# Patient Record
Sex: Male | Born: 1965 | Race: White | Hispanic: No | Marital: Married | Smoking: Current every day smoker
Health system: Southern US, Community
[De-identification: ages and names within clinical notes are randomized; demographics above are authoritative.]

## PROBLEM LIST (undated history)

## (undated) DIAGNOSIS — Z7901 Long term (current) use of anticoagulants: Secondary | ICD-10-CM

## (undated) DIAGNOSIS — Z86718 Personal history of other venous thrombosis and embolism: Secondary | ICD-10-CM

## (undated) DIAGNOSIS — D689 Coagulation defect, unspecified: Secondary | ICD-10-CM

## (undated) DIAGNOSIS — F17200 Nicotine dependence, unspecified, uncomplicated: Secondary | ICD-10-CM

## (undated) DIAGNOSIS — F1190 Opioid use, unspecified, uncomplicated: Secondary | ICD-10-CM

## (undated) DIAGNOSIS — G8929 Other chronic pain: Secondary | ICD-10-CM

## (undated) HISTORY — PX: BACK SURGERY: SHX140

## (undated) HISTORY — PX: SPLENECTOMY: SUR1306

---

## 2004-09-12 ENCOUNTER — Ambulatory Visit: Payer: Self-pay | Admitting: Pain Medicine

## 2004-09-13 ENCOUNTER — Ambulatory Visit: Payer: Self-pay | Admitting: Pain Medicine

## 2004-09-27 ENCOUNTER — Ambulatory Visit: Payer: Self-pay | Admitting: Pain Medicine

## 2004-10-10 ENCOUNTER — Ambulatory Visit: Payer: Self-pay | Admitting: Pain Medicine

## 2004-11-02 ENCOUNTER — Ambulatory Visit: Payer: Self-pay | Admitting: Pain Medicine

## 2004-11-22 ENCOUNTER — Ambulatory Visit: Payer: Self-pay | Admitting: Pain Medicine

## 2004-12-07 ENCOUNTER — Ambulatory Visit: Payer: Self-pay | Admitting: Pain Medicine

## 2005-01-08 ENCOUNTER — Inpatient Hospital Stay: Payer: Self-pay | Admitting: Unknown Physician Specialty

## 2005-01-11 ENCOUNTER — Other Ambulatory Visit: Payer: Self-pay

## 2005-07-09 ENCOUNTER — Ambulatory Visit: Payer: Self-pay | Admitting: Unknown Physician Specialty

## 2006-08-14 ENCOUNTER — Emergency Department: Payer: Self-pay | Admitting: Emergency Medicine

## 2010-08-16 ENCOUNTER — Ambulatory Visit: Payer: Self-pay | Admitting: Unknown Physician Specialty

## 2010-12-28 ENCOUNTER — Ambulatory Visit: Payer: Medicare Other | Admitting: Unknown Physician Specialty

## 2011-02-06 ENCOUNTER — Encounter: Payer: Self-pay | Admitting: Unknown Physician Specialty

## 2012-02-12 ENCOUNTER — Emergency Department: Payer: Self-pay | Admitting: Emergency Medicine

## 2012-06-29 ENCOUNTER — Emergency Department: Payer: Self-pay | Admitting: *Deleted

## 2012-06-29 LAB — COMPREHENSIVE METABOLIC PANEL
Albumin: 3.8 g/dL (ref 3.4–5.0)
Alkaline Phosphatase: 91 U/L (ref 50–136)
BUN: 9 mg/dL (ref 7–18)
Bilirubin,Total: 0.6 mg/dL (ref 0.2–1.0)
Co2: 30 mmol/L (ref 21–32)
Creatinine: 0.95 mg/dL (ref 0.60–1.30)
EGFR (Non-African Amer.): >60
Glucose: 97 mg/dL (ref 65–99)
SGOT(AST): 36 U/L (ref 15–37)
SGPT (ALT): 30 U/L
Sodium: 140 mmol/L (ref 136–145)

## 2012-08-07 ENCOUNTER — Ambulatory Visit: Payer: Self-pay | Admitting: Family Medicine

## 2013-08-12 ENCOUNTER — Ambulatory Visit: Payer: Self-pay | Admitting: Oncology

## 2013-08-25 ENCOUNTER — Emergency Department: Payer: Self-pay | Admitting: Emergency Medicine

## 2013-09-02 ENCOUNTER — Ambulatory Visit: Payer: Self-pay | Admitting: Oncology

## 2014-10-21 ENCOUNTER — Ambulatory Visit: Payer: Self-pay | Admitting: Internal Medicine

## 2015-05-25 ENCOUNTER — Emergency Department
Admission: EM | Admit: 2015-05-25 | Discharge: 2015-05-25 | Discharge status: Against Medical Advice | Payer: Medicare Other | Attending: Emergency Medicine | Admitting: Emergency Medicine

## 2015-05-25 ENCOUNTER — Encounter: Payer: Self-pay | Admitting: Emergency Medicine

## 2015-05-25 DIAGNOSIS — Y9389 Activity, other specified: Secondary | ICD-10-CM | POA: Insufficient documentation

## 2015-05-25 DIAGNOSIS — Y998 Other external cause status: Secondary | ICD-10-CM | POA: Insufficient documentation

## 2015-05-25 DIAGNOSIS — S61411A Laceration without foreign body of right hand, initial encounter: Secondary | ICD-10-CM | POA: Diagnosis present

## 2015-05-25 DIAGNOSIS — S61401A Unspecified open wound of right hand, initial encounter: Secondary | ICD-10-CM | POA: Diagnosis not present

## 2015-05-25 DIAGNOSIS — Z720 Tobacco use: Secondary | ICD-10-CM | POA: Insufficient documentation

## 2015-05-25 DIAGNOSIS — X58XXXA Exposure to other specified factors, initial encounter: Secondary | ICD-10-CM | POA: Insufficient documentation

## 2015-05-25 DIAGNOSIS — W25XXXA Contact with sharp glass, initial encounter: Secondary | ICD-10-CM | POA: Diagnosis not present

## 2015-05-25 DIAGNOSIS — Y9289 Other specified places as the place of occurrence of the external cause: Secondary | ICD-10-CM | POA: Insufficient documentation

## 2015-05-25 DIAGNOSIS — Z23 Encounter for immunization: Secondary | ICD-10-CM | POA: Insufficient documentation
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
## 2015-05-25 NOTE — ED Notes (Signed)
Pt called in lobby 3 times; no answer.

## 2015-05-26 ENCOUNTER — Emergency Department
Admission: EM | Admit: 2015-05-26 | Discharge: 2015-05-26 | Disposition: A | Discharge status: Home / Self Care | Payer: Medicare Other | Attending: Emergency Medicine | Admitting: Emergency Medicine

## 2015-05-26 ENCOUNTER — Encounter: Payer: Self-pay | Admitting: Urgent Care

## 2015-05-26 ENCOUNTER — Emergency Department: Payer: Medicare Other

## 2015-05-26 DIAGNOSIS — S61411A Laceration without foreign body of right hand, initial encounter: Secondary | ICD-10-CM

## 2015-05-26 HISTORY — DX: Coagulation defect, unspecified: D68.9

## 2015-05-26 MED ORDER — CEPHALEXIN 500 MG PO CAPS: 500.0000 mg | ORAL_CAPSULE | Freq: Two times a day (BID) | ORAL | Status: AC

## 2015-05-26 MED ORDER — MORPHINE SULFATE 2 MG/ML IJ SOLN
2.0000 mg | Freq: Once | INTRAMUSCULAR | Status: AC
  Administered 2015-05-26: 2 mg via INTRAMUSCULAR

## 2015-05-26 MED ORDER — CEPHALEXIN 500 MG PO CAPS
ORAL_CAPSULE | ORAL | Status: AC
  Administered 2015-05-26: 500 mg via ORAL
  Filled 2015-05-26: qty 1

## 2015-05-26 MED ORDER — LIDOCAINE HCL (PF) 1 % IJ SOLN
INTRAMUSCULAR | Status: AC
  Filled 2015-05-26: qty 15

## 2015-05-26 MED ORDER — OXYCODONE-ACETAMINOPHEN 5-325 MG PO TABS
ORAL_TABLET | ORAL | Status: AC
  Administered 2015-05-26: 1 via ORAL
  Filled 2015-05-26: qty 1

## 2015-05-26 MED ORDER — TETANUS-DIPHTH-ACELL PERTUSSIS 5-2.5-18.5 LF-MCG/0.5 IM SUSP
0.5000 mL | Freq: Once | INTRAMUSCULAR | Status: AC
  Administered 2015-05-26: 0.5 mL via INTRAMUSCULAR

## 2015-05-26 MED ORDER — CEPHALEXIN 500 MG PO CAPS
500.0000 mg | ORAL_CAPSULE | Freq: Once | ORAL | Status: AC
  Administered 2015-05-26: 500 mg via ORAL

## 2015-05-26 MED ORDER — TETANUS-DIPHTH-ACELL PERTUSSIS 5-2.5-18.5 LF-MCG/0.5 IM SUSP
INTRAMUSCULAR | Status: AC
  Administered 2015-05-26: 0.5 mL via INTRAMUSCULAR
  Filled 2015-05-26: qty 0.5

## 2015-05-26 MED ORDER — OXYCODONE-ACETAMINOPHEN 5-325 MG PO TABS: 1.0000 | ORAL_TABLET | ORAL | Status: AC | PRN

## 2015-05-26 MED ORDER — MORPHINE SULFATE 2 MG/ML IJ SOLN
INTRAMUSCULAR | Status: AC
  Administered 2015-05-26: 2 mg via INTRAMUSCULAR
  Filled 2015-05-26: qty 1

## 2015-05-26 MED ORDER — OXYCODONE-ACETAMINOPHEN 5-325 MG PO TABS
1.0000 | ORAL_TABLET | Freq: Once | ORAL | Status: AC
  Administered 2015-05-26: 1 via ORAL

## 2015-05-26 NOTE — ED Provider Notes (Signed)
[facility] Emergency Department Provider Note  ____________________________________________  Time seen: 3:15 AM  I have reviewed the triage vital signs and the nursing notes.   HISTORY  Chief Complaint Extremity Laceration      HPI Jonathan Rich is a 49 y.o. male resents with multiple lacerations to the dorsal aspect of the right hand patient apparently put his right hand through a glass window approximately 9 hours before the time of this note. Patient came to the emergency department earlier in the evening but apparently left without being seen and has not returned. Patient is currently taking Xarelto     Past Medical History  Diagnosis Date  . Blood clotting disorder     There are no active problems to display for this patient.   Past Surgical History  Procedure Laterality Date  . Splenectomy    . Splenectomy      No current outpatient prescriptions on file.  Allergies Review of patient's allergies indicates no known allergies.  No family history on file.  Social History History  Substance Use Topics  . Smoking status: Current Every Day Smoker  . Smokeless tobacco: Not on file  . Alcohol Use: Yes    Review of Systems  Constitutional: Negative for fever. Eyes: Negative for visual changes. ENT: Negative for sore throat. Cardiovascular: Negative for chest pain. Respiratory: Negative for shortness of breath. Gastrointestinal: Negative for abdominal pain, vomiting and diarrhea. Genitourinary: Negative for dysuria. Musculoskeletal: Negative for back pain. Skin: positive for multiple lacerations Neurological: Negative for headaches, focal weakness or numbness.   10-point ROS otherwise negative.  ____________________________________________   PHYSICAL EXAM:  VITAL SIGNS: ED Triage Vitals  Enc Vitals Group     BP 05/26/15 0032 113/67 mmHg     Pulse Rate 05/26/15 0032 107     Resp 05/26/15 0032 18     Temp 05/26/15  0032 98.1 F (36.7 C)     Temp Source 05/26/15 0032 Oral     SpO2 05/26/15 0032 95 %     Weight 05/26/15 0032 240 lb (108.863 kg)     Height 05/26/15 0032  (1.778 m)     Head Cir --      Peak Flow --      Pain Score 05/26/15 0033 9     Pain Loc --      Pain Edu? --      Excl. in GC? --      Constitutional: Alert and oriented. Well appearing and in no distress. Eyes: Conjunctivae are normal. PERRL. Normal extraocular movements. ENT   Head: Normocephalic and atraumatic.   Nose: No congestion/rhinnorhea.   Mouth/Throat: Mucous membranes are moist.   Neck: No stridor. Hematological/Lymphatic/Immunilogical: No cervical lymphadenopathy. Cardiovascular: Normal rate, regular rhythm. Normal and symmetric distal pulses are present in all extremities. No murmurs, rubs, or gallops. Respiratory: Normal respiratory effort without tachypnea nor retractions. Breath sounds are clear and equal bilaterally. No wheezes/rales/rhonchi. Gastrointestinal: Soft and nontender. No distention. There is no CVA tenderness. Genitourinary: deferred Musculoskeletal: Nontender with normal range of motion in all extremities. No joint effusions.  No lower extremity tenderness nor edema. Neurologic:  Normal speech and language. No gross focal neurologic deficits are appreciated. Speech is normal.  Skin:  Multiple lacerations noted to the dorsal aspect of the right hand 3, 4, 3 cm respectively. In addition to multiple areas of skin avulsions primarily over the PIP joints Psychiatric: Mood and affect are normal. Speech and behavior are normal. Patient exhibits appropriate  insight and judgment.  ____________________________________________    Radiology:  IMPRESSION: No evidence of fracture or dislocation. No radiopaque foreign bodies seen.     PROCEDURES  Procedure(s) performed: LACERATION REPAIR Performed by: Darci Current Authorized by: Darci Current Consent: Verbal consent  obtained. Risks and benefits: risks, benefits and alternatives were discussed Consent given by: patient Patient identity confirmed: provided demographic data Prepped and Draped in normal sterile fashion Wound explored  Laceration Location: dorsal aspect right hand Laceration Length: 3cm, 4 cm 3cm  No Foreign Bodies seen or palpated  Anesthesia: local infiltration  Local anesthetic: lidocaine 1%  Anesthetic total: 58ml  Irrigation method: syringe Amount of cleaning: standard  Skin closure: 5 0 nylon  Number of sutures: 19  Technique:simple interrupted  Patient tolerance: Patient tolerated the procedure well with no immediate complications.    ____________________________________________   INITIAL IMPRESSION / ASSESSMENT AND PLAN / ED COURSE  Pertinent labs & imaging results that were available during my care of the patient were reviewed by me and considered in my medical decision making (see chart for details).  Keflex prescribed   ____________________________________________   FINAL CLINICAL IMPRESSION(S) / ED DIAGNOSES  Final diagnoses:  Hand laceration, right, initial encounter      Darci Current, MD 05/27/15 0004

## 2015-05-26 NOTE — Discharge Instructions (Signed)
Laceration Care, Adult °A laceration is a cut or lesion that goes through all layers of the skin and into the tissue just beneath the skin. °TREATMENT  °Some lacerations may not require closure. Some lacerations may not be able to be closed due to an increased risk of infection. It is important to see your caregiver as soon as possible after an injury to minimize the risk of infection and maximize the opportunity for successful closure. °If closure is appropriate, pain medicines may be given, if needed. The wound will be cleaned to help prevent infection. Your caregiver will use stitches (sutures), staples, wound glue (adhesive), or skin adhesive strips to repair the laceration. These tools bring the skin edges together to allow for faster healing and a better cosmetic outcome. However, all wounds will heal with a scar. Once the wound has healed, scarring can be minimized by covering the wound with sunscreen during the day for 1 full year. °HOME CARE INSTRUCTIONS  °For sutures or staples: °· Keep the wound clean and dry. °· If you were given a bandage (dressing), you should change it at least once a day. Also, change the dressing if it becomes wet or dirty, or as directed by your caregiver. °· Wash the wound with soap and water 2 times a day. Rinse the wound off with water to remove all soap. Pat the wound dry with a clean towel. °· After cleaning, apply a thin layer of the antibiotic ointment as recommended by your caregiver. This will help prevent infection and keep the dressing from sticking. °· You may shower as usual after the first 24 hours. Do not soak the wound in water until the sutures are removed. °· Only take over-the-counter or prescription medicines for pain, discomfort, or fever as directed by your caregiver. °· Get your sutures or staples removed as directed by your caregiver. °For skin adhesive strips: °· Keep the wound clean and dry. °· Do not get the skin adhesive strips wet. You may bathe  carefully, using caution to keep the wound dry. °· If the wound gets wet, pat it dry with a clean towel. °· Skin adhesive strips will fall off on their own. You may trim the strips as the wound heals. Do not remove skin adhesive strips that are still stuck to the wound. They will fall off in time. °For wound adhesive: °· You may briefly wet your wound in the shower or bath. Do not soak or scrub the wound. Do not swim. Avoid periods of heavy perspiration until the skin adhesive has fallen off on its own. After showering or bathing, gently pat the wound dry with a clean towel. °· Do not apply liquid medicine, cream medicine, or ointment medicine to your wound while the skin adhesive is in place. This may loosen the film before your wound is healed. °· If a dressing is placed over the wound, be careful not to apply tape directly over the skin adhesive. This may cause the adhesive to be pulled off before the wound is healed. °· Avoid prolonged exposure to sunlight or tanning lamps while the skin adhesive is in place. Exposure to ultraviolet light in the first year will darken the scar. °· The skin adhesive will usually remain in place for 5 to 10 days, then naturally fall off the skin. Do not pick at the adhesive film. °You may need a tetanus shot if: °· You cannot remember when you had your last tetanus shot. °· You have never had a tetanus   shot. If you get a tetanus shot, your arm may swell, get red, and feel warm to the touch. This is common and not a problem. If you need a tetanus shot and you choose not to have one, there is a rare chance of getting tetanus. Sickness from tetanus can be serious. SEEK MEDICAL CARE IF:   You have redness, swelling, or increasing pain in the wound.  You see a red line that goes away from the wound.  You have yellowish-white fluid (pus) coming from the wound.  You have a fever.  You notice a bad smell coming from the wound or dressing.  Your wound breaks open before or  after sutures have been removed.  You notice something coming out of the wound such as wood or glass.  Your wound is on your hand or foot and you cannot move a finger or toe. SEEK IMMEDIATE MEDICAL CARE IF:   Your pain is not controlled with prescribed medicine.  You have severe swelling around the wound causing pain and numbness or a change in color in your arm, hand, leg, or foot.  Your wound splits open and starts bleeding.  You have worsening numbness, weakness, or loss of function of any joint around or beyond the wound.  You develop painful lumps near the wound or on the skin anywhere on your body. MAKE SURE YOU:   Understand these instructions.  Will watch your condition.  Will get help right away if you are not doing well or get worse. Document Released: 11/19/2005 Document Revised: 02/11/2012 Document Reviewed: 05/15/2011 [facility] Patient Information 2015 [place], [place]. This information is not intended to replace advice given to you by your health care provider. Make sure you discuss any questions you have with your health care provider. PLEASE HAVE SUTURES REMOVED IN 7 DAYS

## 2015-05-26 NOTE — ED Notes (Signed)
Patient presents with significant lacerations to his RIGHT hand - put hand through a plate glass window approx 7 hours ago. Patient presented for care, but thought it was not bad enough to stay and ended up leaving earlier tonight. Patient on Xarelto; area with active bleeding.

## 2016-09-21 ENCOUNTER — Encounter: Payer: Self-pay | Admitting: *Deleted

## 2016-09-21 ENCOUNTER — Emergency Department
Admission: EM | Admit: 2016-09-21 | Discharge: 2016-09-21 | Disposition: A | Discharge status: Home / Self Care | Payer: Medicare Other | Attending: Emergency Medicine | Admitting: Emergency Medicine

## 2016-09-21 DIAGNOSIS — M545 Low back pain: Secondary | ICD-10-CM | POA: Diagnosis not present

## 2016-09-21 DIAGNOSIS — G8929 Other chronic pain: Secondary | ICD-10-CM | POA: Insufficient documentation

## 2016-09-21 DIAGNOSIS — F17200 Nicotine dependence, unspecified, uncomplicated: Secondary | ICD-10-CM | POA: Insufficient documentation

## 2016-09-21 MED ORDER — OXYCODONE HCL 5 MG PO TABS
10.0000 mg | ORAL_TABLET | Freq: Once | ORAL | Status: AC
  Administered 2016-09-21: 10 mg via ORAL
  Filled 2016-09-21: qty 2

## 2016-09-21 MED ORDER — KETOROLAC TROMETHAMINE 30 MG/ML IJ SOLN
60.0000 mg | Freq: Once | INTRAMUSCULAR | Status: AC
  Administered 2016-09-21: 60 mg via INTRAMUSCULAR
  Filled 2016-09-21: qty 2

## 2016-09-21 NOTE — Discharge Instructions (Signed)
Please contact your doctor to discuss alternative options for controlling your pain. Return to the ER for worsening symptoms, persistent vomiting, difficult breathing, losing control of your bowel or bladder, or the concerns.

## 2016-09-21 NOTE — ED Triage Notes (Signed)
Left lower back pain that radiates down the left leg, pain worse over the past couple of days.

## 2016-09-21 NOTE — ED Notes (Signed)
Discharge instructions reviewed with patient. Questions fielded by this RN. Patient verbalizes understanding of instructions. Patient discharged home in stable condition per Sung MD . No acute distress noted at time of discharge.   
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
## 2020-08-30 ENCOUNTER — Inpatient Hospital Stay (HOSPITAL_COMMUNITY)
Admission: EM | Admit: 2020-08-30 | Discharge: 2020-09-05 | DRG: 516 | Disposition: A | Discharge status: Home Health | Payer: Medicare Other | Attending: General Surgery | Admitting: General Surgery

## 2020-08-30 ENCOUNTER — Emergency Department (HOSPITAL_COMMUNITY): Payer: Medicare Other

## 2020-08-30 DIAGNOSIS — R072 Precordial pain: Secondary | ICD-10-CM | POA: Diagnosis not present

## 2020-08-30 DIAGNOSIS — Z20822 Contact with and (suspected) exposure to covid-19: Secondary | ICD-10-CM | POA: Diagnosis not present

## 2020-08-30 DIAGNOSIS — Z9081 Acquired absence of spleen: Secondary | ICD-10-CM | POA: Diagnosis not present

## 2020-08-30 DIAGNOSIS — D62 Acute posthemorrhagic anemia: Secondary | ICD-10-CM | POA: Diagnosis not present

## 2020-08-30 DIAGNOSIS — G8929 Other chronic pain: Secondary | ICD-10-CM | POA: Diagnosis not present

## 2020-08-30 DIAGNOSIS — M549 Dorsalgia, unspecified: Secondary | ICD-10-CM | POA: Diagnosis not present

## 2020-08-30 DIAGNOSIS — Y900 Blood alcohol level of less than 20 mg/100 ml: Secondary | ICD-10-CM | POA: Diagnosis not present

## 2020-08-30 DIAGNOSIS — F10129 Alcohol abuse with intoxication, unspecified: Secondary | ICD-10-CM | POA: Diagnosis not present

## 2020-08-30 DIAGNOSIS — S9002XA Contusion of left ankle, initial encounter: Secondary | ICD-10-CM | POA: Diagnosis not present

## 2020-08-30 DIAGNOSIS — S32422A Displaced fracture of posterior wall of left acetabulum, initial encounter for closed fracture: Secondary | ICD-10-CM | POA: Diagnosis not present

## 2020-08-30 DIAGNOSIS — S02402A Zygomatic fracture, unspecified, initial encounter for closed fracture: Secondary | ICD-10-CM | POA: Diagnosis not present

## 2020-08-30 DIAGNOSIS — Z79891 Long term (current) use of opiate analgesic: Secondary | ICD-10-CM | POA: Diagnosis not present

## 2020-08-30 DIAGNOSIS — S2220XA Unspecified fracture of sternum, initial encounter for closed fracture: Secondary | ICD-10-CM | POA: Diagnosis not present

## 2020-08-30 DIAGNOSIS — Y92410 Unspecified street and highway as the place of occurrence of the external cause: Secondary | ICD-10-CM | POA: Diagnosis not present

## 2020-08-30 DIAGNOSIS — S2243XA Multiple fractures of ribs, bilateral, initial encounter for closed fracture: Secondary | ICD-10-CM | POA: Diagnosis not present

## 2020-08-30 DIAGNOSIS — Z86718 Personal history of other venous thrombosis and embolism: Secondary | ICD-10-CM | POA: Diagnosis not present

## 2020-08-30 DIAGNOSIS — R339 Retention of urine, unspecified: Secondary | ICD-10-CM | POA: Diagnosis not present

## 2020-08-30 DIAGNOSIS — F17200 Nicotine dependence, unspecified, uncomplicated: Secondary | ICD-10-CM | POA: Diagnosis not present

## 2020-08-30 DIAGNOSIS — Z23 Encounter for immunization: Secondary | ICD-10-CM | POA: Diagnosis not present

## 2020-08-30 DIAGNOSIS — S01111A Laceration without foreign body of right eyelid and periocular area, initial encounter: Secondary | ICD-10-CM | POA: Diagnosis not present

## 2020-08-30 DIAGNOSIS — R4182 Altered mental status, unspecified: Secondary | ICD-10-CM | POA: Diagnosis not present

## 2020-08-30 DIAGNOSIS — J342 Deviated nasal septum: Secondary | ICD-10-CM | POA: Diagnosis not present

## 2020-08-30 DIAGNOSIS — Z7901 Long term (current) use of anticoagulants: Secondary | ICD-10-CM | POA: Diagnosis not present

## 2020-08-30 DIAGNOSIS — T1490XA Injury, unspecified, initial encounter: Secondary | ICD-10-CM | POA: Diagnosis not present

## 2020-08-30 DIAGNOSIS — S022XXA Fracture of nasal bones, initial encounter for closed fracture: Secondary | ICD-10-CM | POA: Diagnosis not present

## 2020-08-30 DIAGNOSIS — E8889 Other specified metabolic disorders: Secondary | ICD-10-CM | POA: Diagnosis not present

## 2020-08-30 DIAGNOSIS — F1190 Opioid use, unspecified, uncomplicated: Secondary | ICD-10-CM | POA: Diagnosis present

## 2020-08-30 DIAGNOSIS — S2222XA Fracture of body of sternum, initial encounter for closed fracture: Secondary | ICD-10-CM

## 2020-08-30 DIAGNOSIS — S0240FA Zygomatic fracture, left side, initial encounter for closed fracture: Secondary | ICD-10-CM

## 2020-08-30 DIAGNOSIS — S32409A Unspecified fracture of unspecified acetabulum, initial encounter for closed fracture: Secondary | ICD-10-CM

## 2020-08-30 DIAGNOSIS — R58 Hemorrhage, not elsewhere classified: Secondary | ICD-10-CM

## 2020-08-30 DIAGNOSIS — R52 Pain, unspecified: Secondary | ICD-10-CM

## 2020-08-30 DIAGNOSIS — E559 Vitamin D deficiency, unspecified: Secondary | ICD-10-CM | POA: Diagnosis present

## 2020-08-30 DIAGNOSIS — Z419 Encounter for procedure for purposes other than remedying health state, unspecified: Secondary | ICD-10-CM

## 2020-08-30 DIAGNOSIS — T148XXA Other injury of unspecified body region, initial encounter: Secondary | ICD-10-CM

## 2020-08-30 DIAGNOSIS — S2242XA Multiple fractures of ribs, left side, initial encounter for closed fracture: Secondary | ICD-10-CM

## 2020-08-30 DIAGNOSIS — S0240DA Maxillary fracture, left side, initial encounter for closed fracture: Secondary | ICD-10-CM

## 2020-08-30 HISTORY — DX: Opioid use, unspecified, uncomplicated: F11.90

## 2020-08-30 HISTORY — DX: Personal history of other venous thrombosis and embolism: Z86.718

## 2020-08-30 HISTORY — DX: Long term (current) use of anticoagulants: Z79.01

## 2020-08-30 HISTORY — DX: Nicotine dependence, unspecified, uncomplicated: F17.200

## 2020-08-30 HISTORY — DX: Other chronic pain: G89.29

## 2020-08-30 LAB — COMPREHENSIVE METABOLIC PANEL
ALT: 79 U/L — ABNORMAL HIGH (ref 0–44)
AST: 92 U/L — ABNORMAL HIGH (ref 15–41)
Albumin: 3.1 g/dL — ABNORMAL LOW (ref 3.5–5.0)
Alkaline Phosphatase: 61 U/L (ref 38–126)
Anion gap: 10 (ref 5–15)
BUN: 8 mg/dL (ref 6–20)
CO2: 23 mmol/L (ref 22–32)
Calcium: 8.4 mg/dL — ABNORMAL LOW (ref 8.9–10.3)
Chloride: 107 mmol/L (ref 98–111)
Creatinine, Ser: 0.96 mg/dL (ref 0.61–1.24)
GFR calc Af Amer: >60 mL/min (ref >60)
GFR calc non Af Amer: >60 mL/min (ref >60)
Glucose, Bld: 129 mg/dL — ABNORMAL HIGH (ref 70–99)
Potassium: 3.5 mmol/L (ref 3.5–5.1)
Sodium: 140 mmol/L (ref 135–145)
Total Bilirubin: 0.5 mg/dL (ref 0.3–1.2)
Total Protein: 6.2 g/dL — ABNORMAL LOW (ref 6.5–8.1)

## 2020-08-30 LAB — I-STAT CHEM 8, ED
BUN: 9 mg/dL (ref 6–20)
Calcium, Ion: 1.02 mmol/L — ABNORMAL LOW (ref 1.15–1.40)
Chloride: 104 mmol/L (ref 98–111)
Creatinine, Ser: 0.9 mg/dL (ref 0.61–1.24)
Glucose, Bld: 122 mg/dL — ABNORMAL HIGH (ref 70–99)
HCT: 44 % (ref 39.0–52.0)
Hemoglobin: 15 g/dL (ref 13.0–17.0)
Potassium: 3.6 mmol/L (ref 3.5–5.1)
Sodium: 143 mmol/L (ref 135–145)
TCO2: 24 mmol/L (ref 22–32)

## 2020-08-30 LAB — CBC
HCT: 45.7 % (ref 39.0–52.0)
Hemoglobin: 15.1 g/dL (ref 13.0–17.0)
MCH: 33 pg (ref 26.0–34.0)
MCHC: 33 g/dL (ref 30.0–36.0)
MCV: 99.8 fL (ref 80.0–100.0)
Platelets: 354 10*3/uL (ref 150–400)
RBC: 4.58 MIL/uL (ref 4.22–5.81)
RDW: 13.9 % (ref 11.5–15.5)
WBC: 18.6 10*3/uL — ABNORMAL HIGH (ref 4.0–10.5)
nRBC: 0 % (ref 0.0–0.2)

## 2020-08-30 LAB — LACTIC ACID, PLASMA: Lactic Acid, Venous: 2.8 mmol/L (ref 0.5–1.9)

## 2020-08-30 LAB — PROTIME-INR
INR: 1.7 — ABNORMAL HIGH (ref 0.8–1.2)
Prothrombin Time: 19 seconds — ABNORMAL HIGH (ref 11.4–15.2)

## 2020-08-30 LAB — SAMPLE TO BLOOD BANK

## 2020-08-30 LAB — ETHANOL: Alcohol, Ethyl (B): 12 mg/dL — ABNORMAL HIGH (ref <10)

## 2020-08-30 MED ORDER — MORPHINE SULFATE (PF) 4 MG/ML IV SOLN
4.0000 mg | Freq: Once | INTRAVENOUS | Status: AC
  Administered 2020-08-30: 4 mg via INTRAVENOUS
  Filled 2020-08-30: qty 1

## 2020-08-30 MED ORDER — LIDOCAINE-EPINEPHRINE 1 %-1:100000 IJ SOLN
10.0000 mL | Freq: Once | INTRAMUSCULAR | Status: DC
  Filled 2020-08-30: qty 1

## 2020-08-30 MED ORDER — IOHEXOL 300 MG/ML  SOLN
100.0000 mL | Freq: Once | INTRAMUSCULAR | Status: AC | PRN
  Administered 2020-08-30: 100 mL via INTRAVENOUS

## 2020-08-30 MED ORDER — FENTANYL CITRATE (PF) 100 MCG/2ML IJ SOLN
100.0000 ug | Freq: Once | INTRAMUSCULAR | Status: AC
  Administered 2020-08-30: 100 ug via INTRAVENOUS
  Filled 2020-08-30: qty 2

## 2020-08-30 MED ORDER — LACTATED RINGERS IV BOLUS
1000.0000 mL | Freq: Once | INTRAVENOUS | Status: AC
  Administered 2020-08-30: 1000 mL via INTRAVENOUS

## 2020-08-30 MED ORDER — TETANUS-DIPHTH-ACELL PERTUSSIS 5-2.5-18.5 LF-MCG/0.5 IM SUSP
0.5000 mL | Freq: Once | INTRAMUSCULAR | Status: AC
  Administered 2020-08-30: 0.5 mL via INTRAMUSCULAR
  Filled 2020-08-30: qty 0.5
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
## 2020-08-30 NOTE — ED Notes (Signed)
Pt heard yelling from room for help. This Clinical research associate entered the room to see what pt needed. Pt stated "I need to get out of this goddamn hospital. I'm sitting in here yelling for help for 30 minutes and no one comes in." This writer explained that staff have been in caring for other critical patients as well as him. Pt stated "Y'all have done nothing for me. I want to get the hell out of here. This is a shame." This Clinical research associate attempted to explained to the pt again that we have been caring for other patients as well. Carmie Kanner, RN notified.
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
## 2020-08-30 NOTE — ED Provider Notes (Signed)
LACERATION REPAIR Performed by: Arnoldo Hooker Authorized by: Arnoldo Hooker Consent: Verbal consent obtained. Risks and benefits: risks, benefits and alternatives were discussed Consent given by: patient Patient identity confirmed: provided demographic data Prepped and Draped in normal sterile fashion Wound explored  Laceration Location: right eye brow  Laceration Length: 3 cm  No Foreign Bodies seen or palpated  Anesthesia: local infiltration  Local anesthetic: lidocaine 2% w/epinephrine  Anesthetic total: 2 ml  Irrigation method: syringe Amount of cleaning: standard  Skin closure: 5-0 fast absorbing  Number of sutures: 5  Technique: simple interrupted  Patient tolerance: Patient tolerated the procedure well with no immediate complications.    Elpidio Anis, PA-C 08/30/20 2257    Little, Ambrose Finland, MD 08/30/20 [phone]

## 2020-08-31 ENCOUNTER — Encounter (HOSPITAL_COMMUNITY): Payer: Self-pay

## 2020-08-31 ENCOUNTER — Observation Stay (HOSPITAL_COMMUNITY): Payer: Medicare Other

## 2020-08-31 DIAGNOSIS — Z86718 Personal history of other venous thrombosis and embolism: Secondary | ICD-10-CM

## 2020-08-31 DIAGNOSIS — Z7901 Long term (current) use of anticoagulants: Secondary | ICD-10-CM

## 2020-08-31 DIAGNOSIS — F17200 Nicotine dependence, unspecified, uncomplicated: Secondary | ICD-10-CM | POA: Diagnosis present

## 2020-08-31 DIAGNOSIS — F1190 Opioid use, unspecified, uncomplicated: Secondary | ICD-10-CM | POA: Diagnosis present

## 2020-08-31 LAB — CBC
HCT: 42.2 % (ref 39.0–52.0)
Hemoglobin: 14 g/dL (ref 13.0–17.0)
MCH: 32.4 pg (ref 26.0–34.0)
MCHC: 33.2 g/dL (ref 30.0–36.0)
MCV: 97.7 fL (ref 80.0–100.0)
Platelets: 308 10*3/uL (ref 150–400)
RBC: 4.32 MIL/uL (ref 4.22–5.81)
RDW: 13.8 % (ref 11.5–15.5)
WBC: 13.7 10*3/uL — ABNORMAL HIGH (ref 4.0–10.5)
nRBC: 0 % (ref 0.0–0.2)

## 2020-08-31 LAB — RESPIRATORY PANEL BY RT PCR (FLU A&B, COVID)
Influenza A by PCR: NEGATIVE
Influenza B by PCR: NEGATIVE
SARS Coronavirus 2 by RT PCR: NEGATIVE

## 2020-08-31 LAB — BASIC METABOLIC PANEL
Anion gap: 10 (ref 5–15)
BUN: 10 mg/dL (ref 6–20)
CO2: 25 mmol/L (ref 22–32)
Calcium: 8.4 mg/dL — ABNORMAL LOW (ref 8.9–10.3)
Chloride: 102 mmol/L (ref 98–111)
Creatinine, Ser: 0.77 mg/dL (ref 0.61–1.24)
GFR calc Af Amer: >60 mL/min (ref >60)
GFR calc non Af Amer: >60 mL/min (ref >60)
Glucose, Bld: 130 mg/dL — ABNORMAL HIGH (ref 70–99)
Potassium: 3.6 mmol/L (ref 3.5–5.1)
Sodium: 137 mmol/L (ref 135–145)

## 2020-08-31 LAB — HIV ANTIBODY (ROUTINE TESTING W REFLEX): HIV Screen 4th Generation wRfx: NONREACTIVE

## 2020-08-31 LAB — ABO/RH: ABO/RH(D): A POS

## 2020-08-31 MED ORDER — BETHANECHOL CHLORIDE 5 MG PO TABS
5.0000 mg | ORAL_TABLET | Freq: Three times a day (TID) | ORAL | Status: DC
  Administered 2020-08-31: 5 mg via ORAL
  Filled 2020-08-31 (×4): qty 1

## 2020-08-31 MED ORDER — DEXTROSE-NACL 5-0.9 % IV SOLN: INTRAVENOUS | Status: DC

## 2020-08-31 MED ORDER — OXYCODONE-ACETAMINOPHEN 5-325 MG PO TABS
2.0000 | ORAL_TABLET | Freq: Once | ORAL | Status: AC
  Administered 2020-08-31: 2 via ORAL
  Filled 2020-08-31: qty 2

## 2020-08-31 MED ORDER — LACTATED RINGERS IV BOLUS
1000.0000 mL | Freq: Once | INTRAVENOUS | Status: AC
  Administered 2020-08-31: 1000 mL via INTRAVENOUS

## 2020-08-31 MED ORDER — CHLORHEXIDINE GLUCONATE CLOTH 2 % EX PADS
6.0000 | MEDICATED_PAD | Freq: Every day | CUTANEOUS | Status: DC
  Administered 2020-08-31 – 2020-09-05 (×4): 6 via TOPICAL

## 2020-08-31 MED ORDER — ONDANSETRON 4 MG PO TBDP: 4.0000 mg | ORAL_TABLET | Freq: Four times a day (QID) | ORAL | Status: DC | PRN

## 2020-08-31 MED ORDER — PANTOPRAZOLE SODIUM 40 MG IV SOLR
40.0000 mg | Freq: Every day | INTRAVENOUS | Status: DC
  Administered 2020-08-31: 40 mg via INTRAVENOUS
  Filled 2020-08-31 (×3): qty 40

## 2020-08-31 MED ORDER — HYDROMORPHONE HCL 1 MG/ML IJ SOLN: 1.0000 mg | Freq: Once | INTRAMUSCULAR | Status: DC

## 2020-08-31 MED ORDER — CEFAZOLIN SODIUM-DEXTROSE 2-4 GM/100ML-% IV SOLN
2.0000 g | INTRAVENOUS | Status: AC
  Administered 2020-09-01: 2 g via INTRAVENOUS
  Filled 2020-08-31: qty 100

## 2020-08-31 MED ORDER — ONDANSETRON HCL 4 MG/2ML IJ SOLN: 4.0000 mg | Freq: Four times a day (QID) | INTRAMUSCULAR | Status: DC | PRN

## 2020-08-31 MED ORDER — OXYCODONE HCL 5 MG PO TABS
10.0000 mg | ORAL_TABLET | ORAL | Status: DC | PRN
  Administered 2020-08-31 – 2020-09-02 (×5): 10 mg via ORAL
  Administered 2020-09-02: 15 mg via ORAL
  Administered 2020-09-03: 10 mg via ORAL
  Administered 2020-09-03: 15 mg via ORAL
  Administered 2020-09-03: 10 mg via ORAL
  Administered 2020-09-03 – 2020-09-04 (×3): 15 mg via ORAL
  Administered 2020-09-04 (×2): 10 mg via ORAL
  Administered 2020-09-05 (×3): 15 mg via ORAL
  Filled 2020-08-31: qty 3
  Filled 2020-08-31 (×4): qty 2
  Filled 2020-08-31: qty 3
  Filled 2020-08-31: qty 2
  Filled 2020-08-31: qty 3
  Filled 2020-08-31: qty 2
  Filled 2020-08-31 (×2): qty 3
  Filled 2020-08-31 (×2): qty 2
  Filled 2020-08-31 (×2): qty 3
  Filled 2020-08-31 (×2): qty 2
  Filled 2020-08-31: qty 3

## 2020-08-31 MED ORDER — HYDROMORPHONE HCL 1 MG/ML IJ SOLN
1.0000 mg | INTRAMUSCULAR | Status: DC | PRN
  Administered 2020-08-31 – 2020-09-01 (×3): 1 mg via INTRAVENOUS
  Administered 2020-09-01: 2 mg via INTRAVENOUS
  Administered 2020-09-02: 1 mg via INTRAVENOUS
  Filled 2020-08-31: qty 1
  Filled 2020-08-31: qty 2
  Filled 2020-08-31 (×3): qty 1
  Filled 2020-08-31: qty 2

## 2020-08-31 MED ORDER — DOCUSATE SODIUM 100 MG PO CAPS
100.0000 mg | ORAL_CAPSULE | Freq: Two times a day (BID) | ORAL | Status: DC
  Administered 2020-08-31 – 2020-09-05 (×10): 100 mg via ORAL
  Filled 2020-08-31 (×10): qty 1

## 2020-08-31 MED ORDER — OXYCODONE HCL 5 MG PO TABS
10.0000 mg | ORAL_TABLET | ORAL | Status: DC | PRN
  Filled 2020-08-31: qty 2

## 2020-08-31 MED ORDER — PANTOPRAZOLE SODIUM 40 MG PO TBEC
40.0000 mg | DELAYED_RELEASE_TABLET | Freq: Every day | ORAL | Status: DC
  Administered 2020-09-02 – 2020-09-05 (×4): 40 mg via ORAL
  Filled 2020-08-31 (×4): qty 1
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
## 2020-08-31 NOTE — Progress Notes (Signed)
Orthopedic Tech Progress Note Patient Details:  Jonathan Rich [birth year] [id number] Patient had a concern about the KNEE IMMOBILIZER and didn't want it on at the time untill he spoke with MD/PA.. I called PA and he said to leave it in the room and he would call the RN. Patient ID: [name], male   DOB: October 26, 1966, 54 y.o.   MRN: [id number]   [name] 08/31/2020, 11:34 AM

## 2020-08-31 NOTE — Progress Notes (Signed)
Orthopedic Tech Progress Note Patient Details:  Jonathan Rich 1966/07/07 [id number]  Ortho Devices Type of Ortho Device: Knee Immobilizer Ortho Device/Splint Location: LLE Ortho Device/Splint Interventions: Application, Adjustment   Post Interventions Patient Tolerated: Well Instructions Provided: Care of device   [name] 08/31/2020, 5:49 PM

## 2020-08-31 NOTE — Plan of Care (Signed)
  Problem: Education: Goal: Knowledge of General Education information will improve Description: Including pain rating scale, medication(s)/side effects and non-pharmacologic comfort measures Outcome: Progressing   Problem: Coping: Goal: Level of anxiety will decrease Outcome: Progressing  Updates given to patient and family, emotional support provided. Problem: Elimination: Goal: Will not experience complications related to urinary retention Outcome: Progressing  Foley placed due to urine retention reported by radiology.

## 2020-08-31 NOTE — Progress Notes (Signed)
Radiology calling to report that patient has a large amount of urine retention.  RN attempted to help patient urinate with urinal 3x with different position changes with no result. MD notified, orders rec'd.

## 2020-08-31 NOTE — ED Notes (Signed)
Family at bedside. 

## 2020-08-31 NOTE — ED Notes (Signed)
Pt attempted to get out of the bed, unsuccessful. Pt stated "I'm going home, my wife will take care of me there".
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
## 2020-08-31 NOTE — ED Notes (Signed)
Attempted report 

## 2020-09-01 ENCOUNTER — Observation Stay (HOSPITAL_COMMUNITY): Payer: Medicare Other | Admitting: Anesthesiology

## 2020-09-01 ENCOUNTER — Encounter (HOSPITAL_COMMUNITY): Payer: Self-pay

## 2020-09-01 ENCOUNTER — Observation Stay (HOSPITAL_COMMUNITY): Payer: Medicare Other

## 2020-09-01 ENCOUNTER — Inpatient Hospital Stay (HOSPITAL_COMMUNITY): Payer: Medicare Other

## 2020-09-01 ENCOUNTER — Other Ambulatory Visit: Payer: Self-pay

## 2020-09-01 ENCOUNTER — Encounter (HOSPITAL_COMMUNITY): Admission: EM | Disposition: A | Discharge status: Home Health | Payer: Self-pay | Source: Home / Self Care

## 2020-09-01 DIAGNOSIS — F17200 Nicotine dependence, unspecified, uncomplicated: Secondary | ICD-10-CM | POA: Diagnosis present

## 2020-09-01 DIAGNOSIS — S9002XA Contusion of left ankle, initial encounter: Secondary | ICD-10-CM | POA: Diagnosis present

## 2020-09-01 DIAGNOSIS — G8929 Other chronic pain: Secondary | ICD-10-CM | POA: Diagnosis present

## 2020-09-01 DIAGNOSIS — Z9081 Acquired absence of spleen: Secondary | ICD-10-CM | POA: Diagnosis not present

## 2020-09-01 DIAGNOSIS — S32422A Displaced fracture of posterior wall of left acetabulum, initial encounter for closed fracture: Secondary | ICD-10-CM | POA: Diagnosis present

## 2020-09-01 DIAGNOSIS — Z79891 Long term (current) use of opiate analgesic: Secondary | ICD-10-CM | POA: Diagnosis not present

## 2020-09-01 DIAGNOSIS — J342 Deviated nasal septum: Secondary | ICD-10-CM | POA: Diagnosis present

## 2020-09-01 DIAGNOSIS — Z20822 Contact with and (suspected) exposure to covid-19: Secondary | ICD-10-CM | POA: Diagnosis present

## 2020-09-01 DIAGNOSIS — M549 Dorsalgia, unspecified: Secondary | ICD-10-CM | POA: Diagnosis present

## 2020-09-01 DIAGNOSIS — F10129 Alcohol abuse with intoxication, unspecified: Secondary | ICD-10-CM | POA: Diagnosis present

## 2020-09-01 DIAGNOSIS — R4182 Altered mental status, unspecified: Secondary | ICD-10-CM | POA: Diagnosis present

## 2020-09-01 DIAGNOSIS — Z23 Encounter for immunization: Secondary | ICD-10-CM | POA: Diagnosis present

## 2020-09-01 DIAGNOSIS — S01111A Laceration without foreign body of right eyelid and periocular area, initial encounter: Secondary | ICD-10-CM | POA: Diagnosis present

## 2020-09-01 DIAGNOSIS — R339 Retention of urine, unspecified: Secondary | ICD-10-CM | POA: Diagnosis present

## 2020-09-01 DIAGNOSIS — Z86718 Personal history of other venous thrombosis and embolism: Secondary | ICD-10-CM | POA: Diagnosis not present

## 2020-09-01 DIAGNOSIS — Y92410 Unspecified street and highway as the place of occurrence of the external cause: Secondary | ICD-10-CM | POA: Diagnosis not present

## 2020-09-01 DIAGNOSIS — S022XXA Fracture of nasal bones, initial encounter for closed fracture: Secondary | ICD-10-CM | POA: Diagnosis present

## 2020-09-01 DIAGNOSIS — Z7901 Long term (current) use of anticoagulants: Secondary | ICD-10-CM | POA: Diagnosis not present

## 2020-09-01 DIAGNOSIS — E8889 Other specified metabolic disorders: Secondary | ICD-10-CM | POA: Diagnosis present

## 2020-09-01 DIAGNOSIS — D62 Acute posthemorrhagic anemia: Secondary | ICD-10-CM | POA: Diagnosis present

## 2020-09-01 DIAGNOSIS — S2220XA Unspecified fracture of sternum, initial encounter for closed fracture: Secondary | ICD-10-CM | POA: Diagnosis present

## 2020-09-01 DIAGNOSIS — Y900 Blood alcohol level of less than 20 mg/100 ml: Secondary | ICD-10-CM | POA: Diagnosis not present

## 2020-09-01 DIAGNOSIS — S02402A Zygomatic fracture, unspecified, initial encounter for closed fracture: Secondary | ICD-10-CM | POA: Diagnosis present

## 2020-09-01 DIAGNOSIS — R072 Precordial pain: Secondary | ICD-10-CM | POA: Diagnosis present

## 2020-09-01 DIAGNOSIS — T1490XA Injury, unspecified, initial encounter: Secondary | ICD-10-CM | POA: Diagnosis present

## 2020-09-01 DIAGNOSIS — S2243XA Multiple fractures of ribs, bilateral, initial encounter for closed fracture: Secondary | ICD-10-CM | POA: Diagnosis present

## 2020-09-01 HISTORY — PX: ORIF ACETABULAR FRACTURE: SHX5029

## 2020-09-01 LAB — VITAMIN D 25 HYDROXY (VIT D DEFICIENCY, FRACTURES): Vit D, 25-Hydroxy: 24.01 ng/mL — ABNORMAL LOW (ref 30–100)

## 2020-09-01 LAB — SURGICAL PCR SCREEN
MRSA, PCR: POSITIVE — AB
Staphylococcus aureus: POSITIVE — AB

## 2020-09-01 LAB — TYPE AND SCREEN
ABO/RH(D): A POS
Antibody Screen: NEGATIVE

## 2020-09-01 LAB — PROTIME-INR
INR: 1.1 (ref 0.8–1.2)
Prothrombin Time: 14.2 seconds (ref 11.4–15.2)

## 2020-09-01 LAB — LACTIC ACID, PLASMA: Lactic Acid, Venous: 1.1 mmol/L (ref 0.5–1.9)

## 2020-09-01 SURGERY — OPEN REDUCTION INTERNAL FIXATION (ORIF) ACETABULAR FRACTURE
Anesthesia: General | Site: Pelvis | Laterality: Left

## 2020-09-01 MED ORDER — BISACODYL 5 MG PO TBEC: 5.0000 mg | DELAYED_RELEASE_TABLET | Freq: Every day | ORAL | Status: DC | PRN

## 2020-09-01 MED ORDER — OXYCODONE HCL 5 MG PO TABS: 5.0000 mg | ORAL_TABLET | Freq: Once | ORAL | Status: DC | PRN

## 2020-09-01 MED ORDER — LACTATED RINGERS IV SOLN: INTRAVENOUS | Status: DC | PRN

## 2020-09-01 MED ORDER — CEFAZOLIN SODIUM-DEXTROSE 2-4 GM/100ML-% IV SOLN
2.0000 g | Freq: Four times a day (QID) | INTRAVENOUS | Status: AC
  Administered 2020-09-01 – 2020-09-02 (×3): 2 g via INTRAVENOUS
  Filled 2020-09-01 (×3): qty 100

## 2020-09-01 MED ORDER — LIDOCAINE 2% (20 MG/ML) 5 ML SYRINGE
INTRAMUSCULAR | Status: AC
  Filled 2020-09-01: qty 5

## 2020-09-01 MED ORDER — MUPIROCIN 2 % EX OINT
1.0000 "application " | TOPICAL_OINTMENT | Freq: Two times a day (BID) | CUTANEOUS | Status: DC
  Administered 2020-09-01 – 2020-09-05 (×9): 1 via NASAL
  Filled 2020-09-01 (×3): qty 22

## 2020-09-01 MED ORDER — CHLORHEXIDINE GLUCONATE 0.12 % MT SOLN
OROMUCOSAL | Status: AC
  Administered 2020-09-01: 15 mL
  Filled 2020-09-01: qty 15

## 2020-09-01 MED ORDER — KETAMINE HCL 50 MG/5ML IJ SOSY
PREFILLED_SYRINGE | INTRAMUSCULAR | Status: AC
  Filled 2020-09-01: qty 5

## 2020-09-01 MED ORDER — SUGAMMADEX SODIUM 200 MG/2ML IV SOLN
INTRAVENOUS | Status: DC | PRN
  Administered 2020-09-01: 400 mg via INTRAVENOUS

## 2020-09-01 MED ORDER — MIDAZOLAM HCL 2 MG/2ML IJ SOLN
INTRAMUSCULAR | Status: AC
  Filled 2020-09-01: qty 2

## 2020-09-01 MED ORDER — KETAMINE HCL 10 MG/ML IJ SOLN
INTRAMUSCULAR | Status: DC | PRN
  Administered 2020-09-01: 50 mg via INTRAVENOUS

## 2020-09-01 MED ORDER — ALBUMIN HUMAN 5 % IV SOLN: INTRAVENOUS | Status: DC | PRN

## 2020-09-01 MED ORDER — METOCLOPRAMIDE HCL 5 MG PO TABS: 5.0000 mg | ORAL_TABLET | Freq: Three times a day (TID) | ORAL | Status: DC | PRN

## 2020-09-01 MED ORDER — VANCOMYCIN HCL IN DEXTROSE 1-5 GM/200ML-% IV SOLN
INTRAVENOUS | Status: AC
  Filled 2020-09-01: qty 200

## 2020-09-01 MED ORDER — EPHEDRINE SULFATE 50 MG/ML IJ SOLN
INTRAMUSCULAR | Status: DC | PRN
  Administered 2020-09-01: 10 mg via INTRAVENOUS

## 2020-09-01 MED ORDER — ROCURONIUM BROMIDE 100 MG/10ML IV SOLN
INTRAVENOUS | Status: DC | PRN
  Administered 2020-09-01: 40 mg via INTRAVENOUS
  Administered 2020-09-01: 100 mg via INTRAVENOUS
  Administered 2020-09-01: 60 mg via INTRAVENOUS

## 2020-09-01 MED ORDER — ONDANSETRON HCL 4 MG/2ML IJ SOLN
INTRAMUSCULAR | Status: AC
  Filled 2020-09-01: qty 2

## 2020-09-01 MED ORDER — FENTANYL CITRATE (PF) 100 MCG/2ML IJ SOLN: 25.0000 ug | INTRAMUSCULAR | Status: DC | PRN

## 2020-09-01 MED ORDER — ONDANSETRON HCL 4 MG PO TABS: 4.0000 mg | ORAL_TABLET | Freq: Four times a day (QID) | ORAL | Status: DC | PRN

## 2020-09-01 MED ORDER — ONDANSETRON HCL 4 MG/2ML IJ SOLN: 4.0000 mg | Freq: Four times a day (QID) | INTRAMUSCULAR | Status: DC | PRN

## 2020-09-01 MED ORDER — FENTANYL CITRATE (PF) 250 MCG/5ML IJ SOLN
INTRAMUSCULAR | Status: AC
  Filled 2020-09-01: qty 5

## 2020-09-01 MED ORDER — LACTATED RINGERS IV SOLN: INTRAVENOUS | Status: DC

## 2020-09-01 MED ORDER — METOCLOPRAMIDE HCL 5 MG/ML IJ SOLN: 5.0000 mg | Freq: Three times a day (TID) | INTRAMUSCULAR | Status: DC | PRN

## 2020-09-01 MED ORDER — BETHANECHOL CHLORIDE 10 MG PO TABS
10.0000 mg | ORAL_TABLET | Freq: Three times a day (TID) | ORAL | Status: DC
  Administered 2020-09-01 (×2): 10 mg via ORAL
  Filled 2020-09-01 (×4): qty 1

## 2020-09-01 MED ORDER — VANCOMYCIN HCL 1000 MG IV SOLR
INTRAVENOUS | Status: DC | PRN
  Administered 2020-09-01: 1000 mg via INTRAVENOUS

## 2020-09-01 MED ORDER — 0.9 % SODIUM CHLORIDE (POUR BTL) OPTIME
TOPICAL | Status: DC | PRN
  Administered 2020-09-01: 1000 mL

## 2020-09-01 MED ORDER — ONDANSETRON HCL 4 MG/2ML IJ SOLN
INTRAMUSCULAR | Status: DC | PRN
  Administered 2020-09-01: 4 mg via INTRAVENOUS

## 2020-09-01 MED ORDER — ONDANSETRON HCL 4 MG/2ML IJ SOLN: 4.0000 mg | Freq: Once | INTRAMUSCULAR | Status: DC | PRN

## 2020-09-01 MED ORDER — FENTANYL CITRATE (PF) 100 MCG/2ML IJ SOLN
INTRAMUSCULAR | Status: DC | PRN
Start: 2020-09-01 — End: 2020-09-01
  Administered 2020-09-01: 100 ug via INTRAVENOUS
  Administered 2020-09-01: 150 ug via INTRAVENOUS

## 2020-09-01 MED ORDER — PROPOFOL 10 MG/ML IV BOLUS
INTRAVENOUS | Status: AC
  Filled 2020-09-01: qty 20

## 2020-09-01 MED ORDER — PHENYLEPHRINE HCL-NACL 10-0.9 MG/250ML-% IV SOLN
INTRAVENOUS | Status: DC | PRN
  Administered 2020-09-01: 40 ug/min via INTRAVENOUS

## 2020-09-01 MED ORDER — OXYCODONE HCL 5 MG/5ML PO SOLN: 5.0000 mg | Freq: Once | ORAL | Status: DC | PRN

## 2020-09-01 MED ORDER — DEXAMETHASONE SODIUM PHOSPHATE 10 MG/ML IJ SOLN
INTRAMUSCULAR | Status: AC
  Filled 2020-09-01: qty 1

## 2020-09-01 MED ORDER — PHENYLEPHRINE HCL (PRESSORS) 10 MG/ML IV SOLN
INTRAVENOUS | Status: DC | PRN
  Administered 2020-09-01 (×2): 80 ug via INTRAVENOUS
  Administered 2020-09-01: 120 ug via INTRAVENOUS

## 2020-09-01 MED ORDER — LIDOCAINE HCL (CARDIAC) PF 100 MG/5ML IV SOSY
PREFILLED_SYRINGE | INTRAVENOUS | Status: DC | PRN
  Administered 2020-09-01: 40 mg via INTRAVENOUS

## 2020-09-01 MED ORDER — DEXAMETHASONE SODIUM PHOSPHATE 10 MG/ML IJ SOLN
INTRAMUSCULAR | Status: DC | PRN
  Administered 2020-09-01: 10 mg via INTRAVENOUS

## 2020-09-01 MED ORDER — DOCUSATE SODIUM 100 MG PO CAPS: 100.0000 mg | ORAL_CAPSULE | Freq: Two times a day (BID) | ORAL | Status: DC

## 2020-09-01 MED ORDER — POLYETHYLENE GLYCOL 3350 17 G PO PACK
17.0000 g | PACK | Freq: Every day | ORAL | Status: DC
  Administered 2020-09-01: 17 g via ORAL
  Filled 2020-09-01: qty 1

## 2020-09-01 MED ORDER — CHLORHEXIDINE GLUCONATE CLOTH 2 % EX PADS
6.0000 | MEDICATED_PAD | Freq: Every day | CUTANEOUS | Status: AC
  Administered 2020-09-01 – 2020-09-05 (×5): 6 via TOPICAL

## 2020-09-01 MED ORDER — PROPOFOL 10 MG/ML IV BOLUS
INTRAVENOUS | Status: DC | PRN
  Administered 2020-09-01: 150 mg via INTRAVENOUS

## 2020-09-01 SURGICAL SUPPLY — 74 items
BIT DRILL AO MATTA 2.5MX230M (BIT) ×1 IMPLANT
BIT DRILL STEP 3.5 (DRILL) IMPLANT
BIT DRILL TWST MATTA 3.5MX195M (BIT) ×1 IMPLANT
BLADE CLIPPER SURG (BLADE) IMPLANT
BRUSH SCRUB EZ PLAIN DRY (MISCELLANEOUS) ×4 IMPLANT
COVER SURGICAL LIGHT HANDLE (MISCELLANEOUS) ×2 IMPLANT
COVER WAND RF STERILE (DRAPES) IMPLANT
DRAPE C-ARM 42X72 X-RAY (DRAPES) ×2 IMPLANT
DRAPE C-ARMOR (DRAPES) ×2 IMPLANT
DRAPE HALF SHEET 40X57 (DRAPES) ×2 IMPLANT
DRAPE INCISE IOBAN 66X45 STRL (DRAPES) ×2 IMPLANT
DRAPE INCISE IOBAN 85X60 (DRAPES) ×2 IMPLANT
DRAPE ORTHO SPLIT 77X108 STRL (DRAPES) ×2
DRAPE SURG ORHT 6 SPLT 77X108 (DRAPES) ×2 IMPLANT
DRAPE U-SHAPE 47X51 STRL (DRAPES) ×2 IMPLANT
DRILL BIT AO MATTA 2.5MX230M (BIT) ×2
DRILL STEP 3.5 (DRILL)
DRILL TWIST AO MATTA 3.5MX195M (BIT) ×2
DRSG AQUACEL AG ADV 3.5X14 (GAUZE/BANDAGES/DRESSINGS) ×2 IMPLANT
DRSG MEPILEX BORDER 4X12 (GAUZE/BANDAGES/DRESSINGS) IMPLANT
DRSG MEPILEX BORDER 4X8 (GAUZE/BANDAGES/DRESSINGS) IMPLANT
ELECT BLADE 6.5 EXT (BLADE) ×2 IMPLANT
ELECT REM PT RETURN 9FT ADLT (ELECTROSURGICAL) ×2
ELECTRODE REM PT RTRN 9FT ADLT (ELECTROSURGICAL) ×1 IMPLANT
GLOVE BIO SURGEON STRL SZ7.5 (GLOVE) ×2 IMPLANT
GLOVE BIO SURGEON STRL SZ8 (GLOVE) ×2 IMPLANT
GLOVE BIOGEL PI IND STRL 7.5 (GLOVE) ×1 IMPLANT
GLOVE BIOGEL PI IND STRL 8 (GLOVE) ×1 IMPLANT
GLOVE BIOGEL PI INDICATOR 7.5 (GLOVE) ×1
GLOVE BIOGEL PI INDICATOR 8 (GLOVE) ×1
GOWN STRL REUS W/ TWL LRG LVL3 (GOWN DISPOSABLE) ×2 IMPLANT
GOWN STRL REUS W/ TWL XL LVL3 (GOWN DISPOSABLE) ×2 IMPLANT
GOWN STRL REUS W/TWL LRG LVL3 (GOWN DISPOSABLE) ×2
GOWN STRL REUS W/TWL XL LVL3 (GOWN DISPOSABLE) ×2
HANDPIECE INTERPULSE COAX TIP (DISPOSABLE) ×1
KIT BASIN OR (CUSTOM PROCEDURE TRAY) ×2 IMPLANT
KIT TURNOVER KIT B (KITS) ×2 IMPLANT
MANIFOLD NEPTUNE II (INSTRUMENTS) ×2 IMPLANT
NS IRRIG 1000ML POUR BTL (IV SOLUTION) ×2 IMPLANT
PACK TOTAL JOINT (CUSTOM PROCEDURE TRAY) ×2 IMPLANT
PAD ARMBOARD 7.5X6 YLW CONV (MISCELLANEOUS) ×4 IMPLANT
PIN APEX 6X180MM EXFIX (EXFIX) ×2 IMPLANT
PLATE ACET STRT 94.5M 8H (Plate) ×2 IMPLANT
PLATE SPRING 3.5MM 3H STRL (Plate) ×2 IMPLANT
RETRACTOR ONETRAX LX 135X30 (MISCELLANEOUS) ×2 IMPLANT
RETRIEVER SUT HEWSON (MISCELLANEOUS) ×2 IMPLANT
SCREW CORT 48X3.5XST NS LF (Screw) ×1 IMPLANT
SCREW CORTEX ST MATTA 3.5X30MM (Screw) ×2 IMPLANT
SCREW CORTEX ST MATTA 3.5X32MM (Screw) ×2 IMPLANT
SCREW CORTEX ST MATTA 3.5X36MM (Screw) ×2 IMPLANT
SCREW CORTEX ST MATTA 3.5X40MM (Screw) ×4 IMPLANT
SCREW CORTICAL 3.5X48MM (Screw) ×1 IMPLANT
SET HNDPC FAN SPRY TIP SCT (DISPOSABLE) ×1 IMPLANT
SPONGE LAP 18X18 RF (DISPOSABLE) IMPLANT
STAPLER VISISTAT 35W (STAPLE) ×2 IMPLANT
STRIP CLOSURE SKIN 1/2X4 (GAUZE/BANDAGES/DRESSINGS) ×2 IMPLANT
SUCTION FRAZIER HANDLE 10FR (MISCELLANEOUS) ×1
SUCTION TUBE FRAZIER 10FR DISP (MISCELLANEOUS) ×1 IMPLANT
SUT ETHILON 2 0 PSLX (SUTURE) ×6 IMPLANT
SUT FIBERWIRE #2 38 T-5 BLUE (SUTURE) ×4
SUT VIC AB 0 CT1 27 (SUTURE) ×2
SUT VIC AB 0 CT1 27XBRD ANBCTR (SUTURE) ×2 IMPLANT
SUT VIC AB 1 CT1 18XCR BRD 8 (SUTURE) ×1 IMPLANT
SUT VIC AB 1 CT1 27 (SUTURE) ×1
SUT VIC AB 1 CT1 27XBRD ANBCTR (SUTURE) ×1 IMPLANT
SUT VIC AB 1 CT1 36 (SUTURE) IMPLANT
SUT VIC AB 1 CT1 8-18 (SUTURE) ×1
SUT VIC AB 2-0 CT1 27 (SUTURE) ×3
SUT VIC AB 2-0 CT1 TAPERPNT 27 (SUTURE) ×3 IMPLANT
SUTURE FIBERWR #2 38 T-5 BLUE (SUTURE) ×2 IMPLANT
TOWEL GREEN STERILE (TOWEL DISPOSABLE) ×4 IMPLANT
TOWEL GREEN STERILE FF (TOWEL DISPOSABLE) ×4 IMPLANT
TRAY FOLEY MTR SLVR 16FR STAT (SET/KITS/TRAYS/PACK) IMPLANT
WATER STERILE IRR 1000ML POUR (IV SOLUTION) IMPLANT

## 2020-09-01 NOTE — Progress Notes (Signed)
Patient's surgical PCR screen + for Staph and MRSA . Standing orders initiated.

## 2020-09-01 NOTE — Anesthesia Preprocedure Evaluation (Signed)
Anesthesia Evaluation  Patient identified by MRN, date of birth, ID band Patient awake    Reviewed: Allergy & Precautions, NPO status , Patient's Chart, lab work & pertinent test results  Airway Mallampati: III  TM Distance: >3 FB Neck ROM: Full   Comment: Ecchymosis of face Dental  (+) Teeth Intact, Dental Advisory Given   Pulmonary Current Smoker,     + decreased breath sounds      Cardiovascular  Rhythm:Regular Rate:Normal     Neuro/Psych    GI/Hepatic   Endo/Other    Renal/GU      Musculoskeletal   Abdominal (+) + obese,   Peds  Hematology   Anesthesia Other Findings   Reproductive/Obstetrics                             Anesthesia Physical Anesthesia Plan  ASA: III  Anesthesia Plan: General   Post-op Pain Management:    Induction: Intravenous  PONV Risk Score and Plan: Ondansetron and Dexamethasone  Airway Management Planned:   Additional Equipment:   Intra-op Plan:   Post-operative Plan: Extubation in OR  Informed Consent: I have reviewed the patients History and Physical, chart, labs and discussed the procedure including the risks, benefits and alternatives for the proposed anesthesia with the patient or authorized representative who has indicated his/her understanding and acceptance.     Dental advisory given  Plan Discussed with: CRNA and Anesthesiologist  Anesthesia Plan Comments:         Anesthesia Quick Evaluation

## 2020-09-01 NOTE — Transfer of Care (Signed)
Immediate Anesthesia Transfer of Care Note  Patient: Jonathan Rich  Procedure(s) Performed: OPEN REDUCTION INTERNAL FIXATION (ORIF) ACETABULAR FRACTURE. ORIF Left posterior wall acetabulum fracture (Left Pelvis)  Patient Location: PACU  Anesthesia Type:General  Level of Consciousness: drowsy and patient cooperative  Airway & Oxygen Therapy: Patient Spontanous Breathing and Patient connected to face mask oxygen  Post-op Assessment: Report given to RN, Post -op Vital signs reviewed and stable and Patient moving all extremities  Post vital signs: Reviewed and stable  Last Vitals:  Vitals Value Taken Time  BP 120/60 09/01/20 1152  Temp 37.1 C 09/01/20 1152  Pulse 96 09/01/20 1200  Resp 28 09/01/20 1200  SpO2 93 % 09/01/20 1200  Vitals shown include unvalidated device data.  Last Pain:  Vitals:   09/01/20 1152  TempSrc:   PainSc: Asleep      Patients Stated Pain Goal: 3 (09/01/20 0714)  Complications: No complications documented.
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
## 2020-09-01 NOTE — Progress Notes (Signed)
Patient arrived to room 6N24 from PACU via bed. Patient is alert and oriented times 4 and sleepy. VSS. NAD. Belongings and call bell within reach. Bed is in the lowest and locked position with bed rails up times 2. Patient oriented to unit and call bell. Patient updated as to plan of care and all questions addressed at this time. Spouse updated to patient condition and location.

## 2020-09-01 NOTE — Progress Notes (Signed)
   09/01/20 1355  Assess: MEWS Score  Temp 99 F (37.2 C)  BP 137/83  Pulse Rate 91  Resp (!) 22  SpO2 94 %  O2 Device Room Air  Assess: MEWS Score  MEWS Temp 0  MEWS Systolic 0  MEWS Pulse 0  MEWS RR 1  MEWS LOC 1  MEWS Score 2  MEWS Score Color Yellow  Assess: if the MEWS score is Yellow or Red  Were vital signs taken at a resting state? Yes  Focused Assessment No change from prior assessment  Early Detection of Sepsis Score *See Row Information* Low  MEWS guidelines implemented *See Row Information* No, vital signs rechecked  Document  Patient Outcome Other (Comment)  Progress note created (see row info) Yes    Patient reassessed upon arrival to unit from PACU. Patient is drowsy but easily aroused. VSS. NAD.

## 2020-09-01 NOTE — Anesthesia Procedure Notes (Signed)
Procedure Name: Intubation Date/Time: 09/01/2020 8:36 AM Performed by: Mostafa Yuan T, CRNA Pre-anesthesia Checklist: Patient identified, Emergency Drugs available, Suction available and Patient being monitored Patient Re-evaluated:Patient Re-evaluated prior to induction Oxygen Delivery Method: Circle system utilized Preoxygenation: Pre-oxygenation with 100% oxygen Induction Type: IV induction Ventilation: Mask ventilation without difficulty Laryngoscope Size: Miller and 3 Grade View: Grade I Tube type: Oral Tube size: 8.0 mm Number of attempts: 1 Airway Equipment and Method: Stylet and Oral airway Placement Confirmation: ETT inserted through vocal cords under direct vision,  positive ETCO2 and breath sounds checked- equal and bilateral Secured at: 22 cm Tube secured with: Tape Dental Injury: Teeth and Oropharynx as per pre-operative assessment

## 2020-09-01 NOTE — Progress Notes (Signed)
Patient taken to OR for ORIF of left acetabular fx. Wedding band removed, placed in specimen cup and secured in closet.

## 2020-09-01 NOTE — Anesthesia Postprocedure Evaluation (Signed)
Anesthesia Post Note  Patient: Jonathan Rich  Procedure(s) Performed: OPEN REDUCTION INTERNAL FIXATION (ORIF) ACETABULAR FRACTURE. ORIF Left posterior wall acetabulum fracture (Left Pelvis)     Patient location during evaluation: PACU Anesthesia Type: General Level of consciousness: awake, oriented and patient cooperative Pain management: pain level controlled Vital Signs Assessment: post-procedure vital signs reviewed and stable Respiratory status: spontaneous breathing, nonlabored ventilation and patient connected to nasal cannula oxygen Cardiovascular status: blood pressure returned to baseline and stable Postop Assessment: no apparent nausea or vomiting Anesthetic complications: no   No complications documented.  Last Vitals:  Vitals:   09/01/20 1330 09/01/20 1355  BP:  137/83  Pulse: 88 91  Resp: 17 (!) 22  Temp:  37.2 C  SpO2: 95% 94%    Last Pain:  Vitals:   09/01/20 1355  TempSrc: Oral  PainSc:                  Jeanean Hollett COKER

## 2020-09-02 ENCOUNTER — Inpatient Hospital Stay (HOSPITAL_COMMUNITY): Payer: Medicare Other

## 2020-09-02 LAB — CBC
HCT: 34.6 % — ABNORMAL LOW (ref 39.0–52.0)
HCT: 34.9 % — ABNORMAL LOW (ref 39.0–52.0)
Hemoglobin: 11.5 g/dL — ABNORMAL LOW (ref 13.0–17.0)
Hemoglobin: 11.8 g/dL — ABNORMAL LOW (ref 13.0–17.0)
MCH: 32.4 pg (ref 26.0–34.0)
MCH: 33.3 pg (ref 26.0–34.0)
MCHC: 33.2 g/dL (ref 30.0–36.0)
MCHC: 33.8 g/dL (ref 30.0–36.0)
MCV: 97.5 fL (ref 80.0–100.0)
MCV: 98.6 fL (ref 80.0–100.0)
Platelets: 254 10*3/uL (ref 150–400)
Platelets: 274 10*3/uL (ref 150–400)
RBC: 3.55 MIL/uL — ABNORMAL LOW (ref 4.22–5.81)
RBC: 3.54 MIL/uL — ABNORMAL LOW (ref 4.22–5.81)
RDW: 13.2 % (ref 11.5–15.5)
RDW: 13.3 % (ref 11.5–15.5)
WBC: 21.6 10*3/uL — ABNORMAL HIGH (ref 4.0–10.5)
WBC: 22.1 10*3/uL — ABNORMAL HIGH (ref 4.0–10.5)
nRBC: 0 % (ref 0.0–0.2)
nRBC: 0 % (ref 0.0–0.2)

## 2020-09-02 MED ORDER — ACETAMINOPHEN 500 MG PO TABS
1000.0000 mg | ORAL_TABLET | Freq: Three times a day (TID) | ORAL | Status: DC
  Administered 2020-09-02 – 2020-09-05 (×11): 1000 mg via ORAL
  Filled 2020-09-02 (×11): qty 2

## 2020-09-02 MED ORDER — POLYETHYLENE GLYCOL 3350 17 G PO PACK
17.0000 g | PACK | Freq: Two times a day (BID) | ORAL | Status: DC
  Administered 2020-09-02 – 2020-09-05 (×4): 17 g via ORAL
  Filled 2020-09-02 (×7): qty 1

## 2020-09-02 MED ORDER — VITAMIN D 25 MCG (1000 UNIT) PO TABS
2000.0000 [IU] | ORAL_TABLET | Freq: Two times a day (BID) | ORAL | Status: DC
  Administered 2020-09-02 – 2020-09-05 (×7): 2000 [IU] via ORAL
  Filled 2020-09-02 (×7): qty 2

## 2020-09-02 MED ORDER — HYDROMORPHONE HCL 1 MG/ML IJ SOLN: 1.0000 mg | INTRAMUSCULAR | Status: DC | PRN

## 2020-09-02 MED ORDER — BETHANECHOL CHLORIDE 25 MG PO TABS
25.0000 mg | ORAL_TABLET | Freq: Three times a day (TID) | ORAL | Status: DC
  Administered 2020-09-02 – 2020-09-05 (×11): 25 mg via ORAL
  Filled 2020-09-02 (×12): qty 1

## 2020-09-02 MED ORDER — ASCORBIC ACID 500 MG PO TABS
1000.0000 mg | ORAL_TABLET | Freq: Every day | ORAL | Status: DC
  Administered 2020-09-02 – 2020-09-05 (×4): 1000 mg via ORAL
  Filled 2020-09-02 (×4): qty 2

## 2020-09-02 MED ORDER — ADULT MULTIVITAMIN W/MINERALS CH
1.0000 | ORAL_TABLET | Freq: Every day | ORAL | Status: DC
  Administered 2020-09-02 – 2020-09-05 (×4): 1 via ORAL
  Filled 2020-09-02 (×4): qty 1

## 2020-09-02 MED ORDER — RIVAROXABAN 20 MG PO TABS
20.0000 mg | ORAL_TABLET | Freq: Every day | ORAL | Status: DC
  Administered 2020-09-02 – 2020-09-04 (×3): 20 mg via ORAL
  Filled 2020-09-02 (×3): qty 1

## 2020-09-02 MED ORDER — NALOXONE HCL 0.4 MG/ML IJ SOLN: 0.4000 mg | INTRAMUSCULAR | Status: DC | PRN

## 2020-09-02 MED ORDER — METHOCARBAMOL 750 MG PO TABS
750.0000 mg | ORAL_TABLET | Freq: Three times a day (TID) | ORAL | Status: DC
  Administered 2020-09-02 – 2020-09-03 (×6): 750 mg via ORAL
  Filled 2020-09-02 (×6): qty 1

## 2020-09-02 MED ORDER — KETOROLAC TROMETHAMINE 15 MG/ML IJ SOLN
15.0000 mg | Freq: Three times a day (TID) | INTRAMUSCULAR | Status: AC
  Administered 2020-09-02 – 2020-09-04 (×9): 15 mg via INTRAVENOUS
  Filled 2020-09-02 (×9): qty 1

## 2020-09-02 NOTE — TOC CAGE-AID Note (Signed)
Transition of Care [facility]) - CAGE-AID Screening   Patient Details  Name: Jonathan Rich MRN: [id number] Date of Birth: 1966-04-26  [facility] CM/SW Contact:    Jimmy Picket, LCSWA Phone Number: 09/02/2020, 2:54 PM   Clinical Narrative:  CSW introduced self via phone and explained role. Pt reports occasional alcohol use. Pt reports 1-2 beers 1 couple times a month. Pt denies substance use. Pt did not need resources at this time.   CAGE-AID Screening:    Have You Ever Felt You Ought to Cut Down on Your Drinking or Drug Use?: No Have People Annoyed You By Critizing Your Drinking Or Drug Use?: No Have You Felt Bad Or Guilty About Your Drinking Or Drug Use?: No Have You Ever Had a Drink or Used Drugs First Thing In The Morning to Steady Your Nerves or to Get Rid of a Hangover?: No CAGE-AID Score: 0  Substance Abuse Education Offered: Yes    Denita Lung, Bridget Hartshorn Clinical Social Worker [phone]

## 2020-09-02 NOTE — Progress Notes (Signed)
Inpatient Rehab Admissions Coordinator Note:   Per PT recommendations, pt was screened for CIR candidacy by Estill Dooms, PT, DPT.  Note pt min guard on eval.  We will not have any beds until mid week on CIR.  Will follow from a distance, and if pt fails to progress we will request a consult.  Please contact me with questions.   Estill Dooms, PT, DPT [phone] 09/02/20 3:57 PM
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
## 2020-09-03 LAB — BASIC METABOLIC PANEL
Anion gap: 8 (ref 5–15)
BUN: 13 mg/dL (ref 6–20)
CO2: 23 mmol/L (ref 22–32)
Calcium: 8 mg/dL — ABNORMAL LOW (ref 8.9–10.3)
Chloride: 107 mmol/L (ref 98–111)
Creatinine, Ser: 0.73 mg/dL (ref 0.61–1.24)
GFR calc Af Amer: >60 mL/min (ref >60)
GFR calc non Af Amer: >60 mL/min (ref >60)
Glucose, Bld: 146 mg/dL — ABNORMAL HIGH (ref 70–99)
Potassium: 3.2 mmol/L — ABNORMAL LOW (ref 3.5–5.1)
Sodium: 138 mmol/L (ref 135–145)

## 2020-09-03 LAB — HEPATIC FUNCTION PANEL
ALT: 32 U/L (ref 0–44)
AST: 38 U/L (ref 15–41)
Albumin: 2.6 g/dL — ABNORMAL LOW (ref 3.5–5.0)
Alkaline Phosphatase: 52 U/L (ref 38–126)
Bilirubin, Direct: <0.1 mg/dL (ref 0.0–0.2)
Total Bilirubin: 0.7 mg/dL (ref 0.3–1.2)
Total Protein: 5.4 g/dL — ABNORMAL LOW (ref 6.5–8.1)

## 2020-09-03 LAB — CBC
HCT: 33.9 % — ABNORMAL LOW (ref 39.0–52.0)
Hemoglobin: 11.2 g/dL — ABNORMAL LOW (ref 13.0–17.0)
MCH: 32.7 pg (ref 26.0–34.0)
MCHC: 33 g/dL (ref 30.0–36.0)
MCV: 98.8 fL (ref 80.0–100.0)
Platelets: 270 10*3/uL (ref 150–400)
RBC: 3.43 MIL/uL — ABNORMAL LOW (ref 4.22–5.81)
RDW: 13.7 % (ref 11.5–15.5)
WBC: 17.4 10*3/uL — ABNORMAL HIGH (ref 4.0–10.5)
nRBC: 0 % (ref 0.0–0.2)

## 2020-09-03 MED ORDER — POTASSIUM CHLORIDE CRYS ER 20 MEQ PO TBCR
40.0000 meq | EXTENDED_RELEASE_TABLET | Freq: Once | ORAL | Status: AC
  Administered 2020-09-03: 40 meq via ORAL
  Filled 2020-09-03: qty 2
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
## 2020-09-04 MED ORDER — POTASSIUM CHLORIDE CRYS ER 20 MEQ PO TBCR
40.0000 meq | EXTENDED_RELEASE_TABLET | Freq: Once | ORAL | Status: AC
  Administered 2020-09-04: 40 meq via ORAL
  Filled 2020-09-04: qty 2

## 2020-09-04 MED ORDER — METHOCARBAMOL 500 MG PO TABS
1000.0000 mg | ORAL_TABLET | Freq: Three times a day (TID) | ORAL | Status: DC
  Administered 2020-09-04 – 2020-09-05 (×5): 1000 mg via ORAL
  Filled 2020-09-04 (×5): qty 2

## 2020-09-04 MED ORDER — GABAPENTIN 300 MG PO CAPS
300.0000 mg | ORAL_CAPSULE | Freq: Three times a day (TID) | ORAL | Status: DC
  Administered 2020-09-04 – 2020-09-05 (×5): 300 mg via ORAL
  Filled 2020-09-04 (×5): qty 1
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
## 2020-09-05 MED ORDER — METHOCARBAMOL 500 MG PO TABS: 1000.0000 mg | ORAL_TABLET | Freq: Three times a day (TID) | ORAL | 0 refills | Status: DC

## 2020-09-05 MED ORDER — ACETAMINOPHEN 500 MG PO TABS: 1000.0000 mg | ORAL_TABLET | Freq: Four times a day (QID) | ORAL | 0 refills | Status: AC | PRN

## 2020-09-05 MED ORDER — OXYCODONE HCL 10 MG PO TABS: 10.0000 mg | ORAL_TABLET | ORAL | 0 refills | Status: DC | PRN

## 2020-09-05 MED ORDER — GABAPENTIN 300 MG PO CAPS
300.0000 mg | ORAL_CAPSULE | Freq: Three times a day (TID) | ORAL | 0 refills | Status: DC
Start: 2020-09-05 — End: 2022-01-01
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
## 2020-09-06 ENCOUNTER — Encounter (HOSPITAL_COMMUNITY): Payer: Self-pay | Admitting: Orthopedic Surgery

## 2020-10-07 ENCOUNTER — Encounter: Payer: Self-pay | Admitting: Emergency Medicine

## 2020-10-07 ENCOUNTER — Emergency Department
Admission: EM | Admit: 2020-10-07 | Discharge: 2020-10-08 | Disposition: A | Discharge status: Home / Self Care | Payer: Medicare Other | Attending: Emergency Medicine | Admitting: Emergency Medicine

## 2020-10-07 ENCOUNTER — Other Ambulatory Visit: Payer: Self-pay

## 2020-10-07 ENCOUNTER — Emergency Department: Payer: Medicare Other

## 2020-10-07 DIAGNOSIS — Z7901 Long term (current) use of anticoagulants: Secondary | ICD-10-CM | POA: Diagnosis not present

## 2020-10-07 DIAGNOSIS — L03116 Cellulitis of left lower limb: Secondary | ICD-10-CM | POA: Diagnosis not present

## 2020-10-07 DIAGNOSIS — M25552 Pain in left hip: Secondary | ICD-10-CM | POA: Diagnosis present

## 2020-10-07 DIAGNOSIS — F17200 Nicotine dependence, unspecified, uncomplicated: Secondary | ICD-10-CM | POA: Diagnosis not present

## 2020-10-07 LAB — CBC WITH DIFFERENTIAL/PLATELET
Abs Immature Granulocytes: 0.04 10*3/uL (ref 0.00–0.07)
Basophils Absolute: 0.1 10*3/uL (ref 0.0–0.1)
Basophils Relative: 1 %
Eosinophils Absolute: 0.4 10*3/uL (ref 0.0–0.5)
Eosinophils Relative: 4 %
HCT: 39.6 % (ref 39.0–52.0)
Hemoglobin: 13.4 g/dL (ref 13.0–17.0)
Immature Granulocytes: 0 %
Lymphocytes Relative: 27 %
Lymphs Abs: 2.8 10*3/uL (ref 0.7–4.0)
MCH: 32.6 pg (ref 26.0–34.0)
MCHC: 33.8 g/dL (ref 30.0–36.0)
MCV: 96.4 fL (ref 80.0–100.0)
Monocytes Absolute: 1 10*3/uL (ref 0.1–1.0)
Monocytes Relative: 10 %
Neutro Abs: 5.9 10*3/uL (ref 1.7–7.7)
Neutrophils Relative %: 58 %
Platelets: 401 10*3/uL — ABNORMAL HIGH (ref 150–400)
RBC: 4.11 MIL/uL — ABNORMAL LOW (ref 4.22–5.81)
RDW: 13.1 % (ref 11.5–15.5)
WBC: 10.2 10*3/uL (ref 4.0–10.5)
nRBC: 0 % (ref 0.0–0.2)

## 2020-10-07 LAB — BASIC METABOLIC PANEL
Anion gap: 10 (ref 5–15)
BUN: 7 mg/dL (ref 6–20)
CO2: 27 mmol/L (ref 22–32)
Calcium: 8.4 mg/dL — ABNORMAL LOW (ref 8.9–10.3)
Chloride: 106 mmol/L (ref 98–111)
Creatinine, Ser: 0.73 mg/dL (ref 0.61–1.24)
GFR, Estimated: >60 mL/min (ref >60)
Glucose, Bld: 116 mg/dL — ABNORMAL HIGH (ref 70–99)
Potassium: 3.9 mmol/L (ref 3.5–5.1)
Sodium: 143 mmol/L (ref 135–145)

## 2020-10-07 MED ORDER — IOHEXOL 300 MG/ML  SOLN
100.0000 mL | Freq: Once | INTRAMUSCULAR | Status: AC | PRN
  Administered 2020-10-07: 100 mL via INTRAVENOUS

## 2020-10-07 NOTE — ED Notes (Signed)
Report given to main ED
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
## 2020-10-08 DIAGNOSIS — L03116 Cellulitis of left lower limb: Secondary | ICD-10-CM | POA: Diagnosis not present

## 2020-10-08 MED ORDER — CEPHALEXIN 500 MG PO CAPS: 500.0000 mg | ORAL_CAPSULE | Freq: Three times a day (TID) | ORAL | 0 refills | Status: DC

## 2020-10-08 MED ORDER — CEPHALEXIN 500 MG PO CAPS
500.0000 mg | ORAL_CAPSULE | Freq: Once | ORAL | Status: AC
  Administered 2020-10-08: 500 mg via ORAL
  Filled 2020-10-08: qty 1

## 2020-10-08 MED ORDER — SULFAMETHOXAZOLE-TRIMETHOPRIM 800-160 MG PO TABS: 1.0000 | ORAL_TABLET | Freq: Two times a day (BID) | ORAL | 0 refills | Status: DC

## 2020-10-08 MED ORDER — OXYCODONE HCL 5 MG PO TABS
20.0000 mg | ORAL_TABLET | ORAL | Status: AC
  Administered 2020-10-08: 20 mg via ORAL
  Filled 2020-10-08: qty 4

## 2020-10-08 MED ORDER — SULFAMETHOXAZOLE-TRIMETHOPRIM 800-160 MG PO TABS
1.0000 | ORAL_TABLET | Freq: Once | ORAL | Status: AC
  Administered 2020-10-08: 1 via ORAL
  Filled 2020-10-08: qty 1
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
## 2020-10-08 NOTE — ED Notes (Signed)
Left hip wound site dressed with sterile abdominal pad dressing. Pt educated on appropriate cleaning and dressing of wound site. Pt verbalized understanding. Pt will f/u with orthopedic surgeon.

## 2020-10-08 NOTE — Discharge Instructions (Signed)
Take antibiotics as prescribed and call Dr. Magdalene Patricia office on Monday for follow up this week.

## 2020-10-19 ENCOUNTER — Encounter: Payer: Self-pay | Admitting: Emergency Medicine
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
## 2021-11-25 IMAGING — RF DG C-ARM 1-60 MIN
1 series · 3 of 3 positions shown · non-contrast
Comparison: August 30, 2020.

CLINICAL DATA: Open reduction and internal fixation of acetabular
fracture.

EXAM:
OPERATIVE left HIP (WITH PELVIS IF PERFORMED) 3 VIEWS
TECHNIQUE: Fluoroscopic spot image(s) were submitted for interpretation
post-operatively.

[Series 1: run · 3 of 3 slices shown]
[im 1/3]
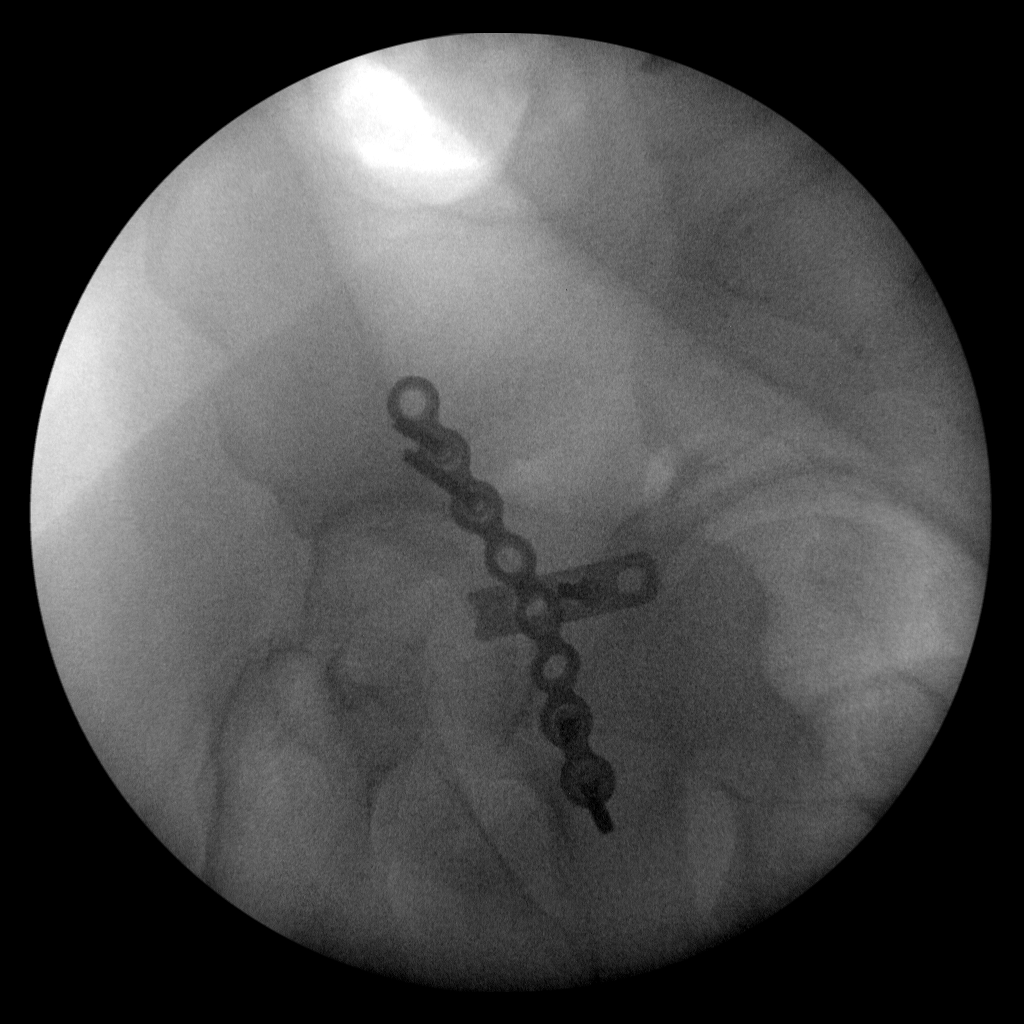
[im 2/3]
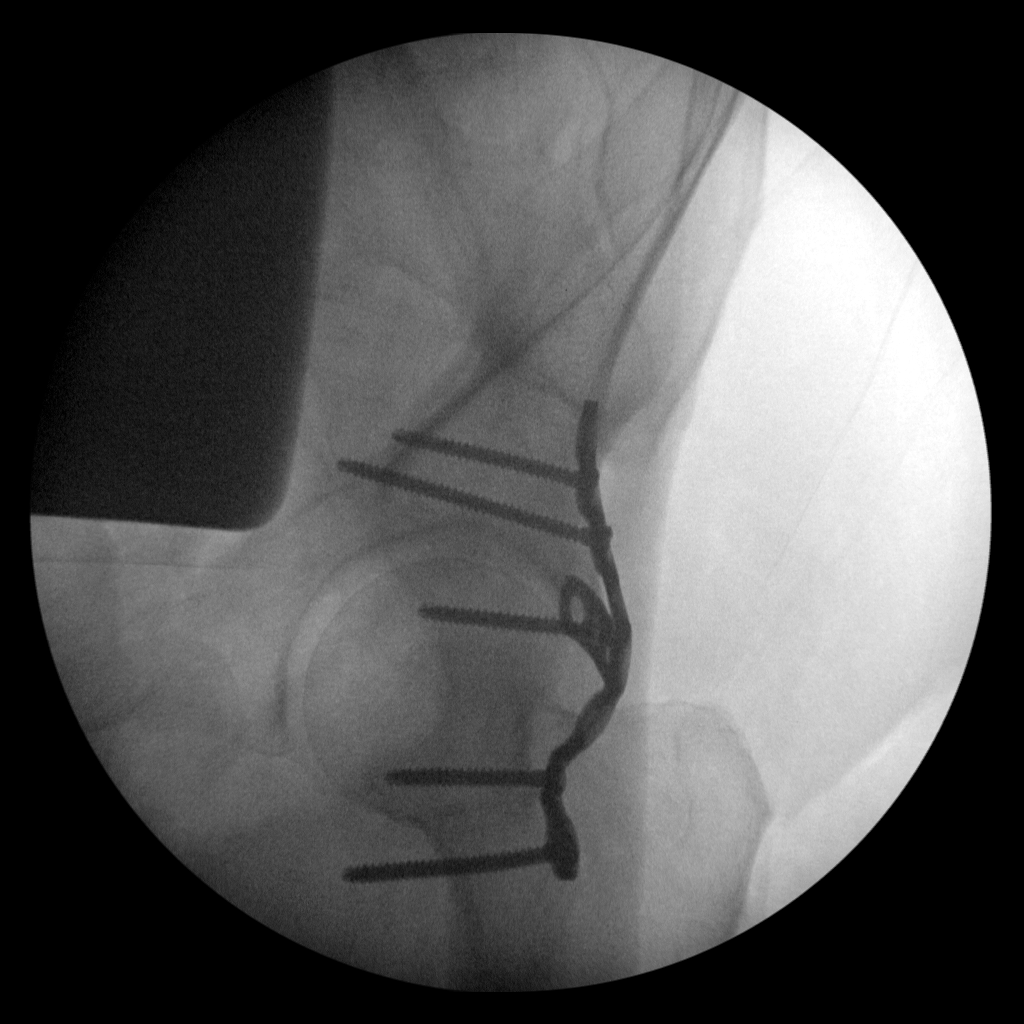
[im 3/3]
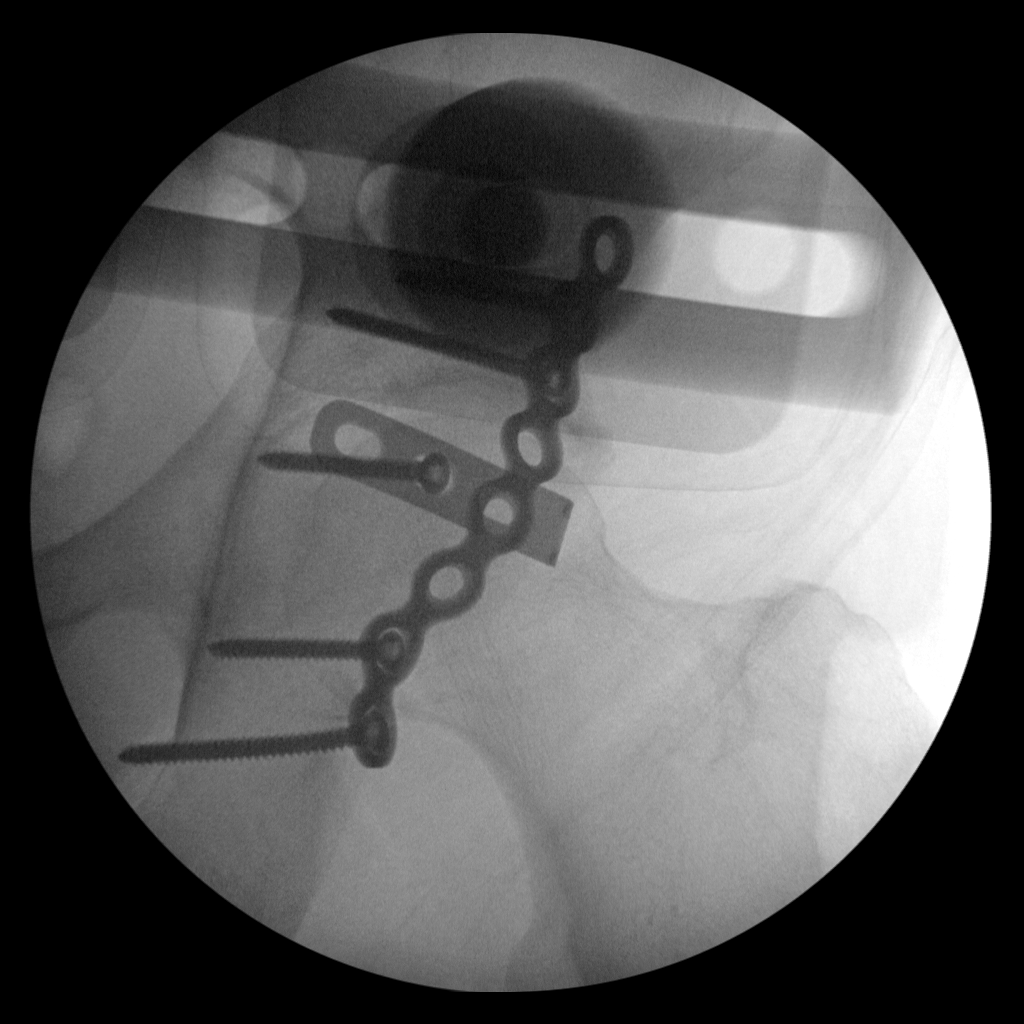

[3 of 3 positions shown; findings below may reference images not displayed]

FINDINGS: Three intraoperative fluoroscopic images were obtained of the left
hip. These demonstrate surgical internal fixation of left acetabular
fracture.
IMPRESSION: Status post surgical internal fixation of left acetabular fracture.

## 2022-01-01 ENCOUNTER — Other Ambulatory Visit: Payer: Self-pay

## 2022-01-01 ENCOUNTER — Emergency Department: Payer: Medicare Other

## 2022-01-01 ENCOUNTER — Inpatient Hospital Stay: Payer: Medicare Other

## 2022-01-01 ENCOUNTER — Inpatient Hospital Stay
Admission: EM | Admit: 2022-01-01 | Discharge: 2022-01-02 | DRG: 917 | Disposition: A | Discharge status: Home / Self Care | Payer: Medicare Other | Attending: Internal Medicine | Admitting: Internal Medicine

## 2022-01-01 DIAGNOSIS — Z9081 Acquired absence of spleen: Secondary | ICD-10-CM | POA: Diagnosis not present

## 2022-01-01 DIAGNOSIS — D689 Coagulation defect, unspecified: Secondary | ICD-10-CM | POA: Diagnosis present

## 2022-01-01 DIAGNOSIS — F418 Other specified anxiety disorders: Secondary | ICD-10-CM | POA: Diagnosis present

## 2022-01-01 DIAGNOSIS — G4733 Obstructive sleep apnea (adult) (pediatric): Secondary | ICD-10-CM | POA: Diagnosis present

## 2022-01-01 DIAGNOSIS — Z7901 Long term (current) use of anticoagulants: Secondary | ICD-10-CM

## 2022-01-01 DIAGNOSIS — T402X1A Poisoning by other opioids, accidental (unintentional), initial encounter: Secondary | ICD-10-CM | POA: Diagnosis present

## 2022-01-01 DIAGNOSIS — G894 Chronic pain syndrome: Secondary | ICD-10-CM | POA: Diagnosis present

## 2022-01-01 DIAGNOSIS — S8254XA Nondisplaced fracture of medial malleolus of right tibia, initial encounter for closed fracture: Secondary | ICD-10-CM | POA: Diagnosis present

## 2022-01-01 DIAGNOSIS — K219 Gastro-esophageal reflux disease without esophagitis: Secondary | ICD-10-CM | POA: Diagnosis present

## 2022-01-01 DIAGNOSIS — F17200 Nicotine dependence, unspecified, uncomplicated: Secondary | ICD-10-CM | POA: Diagnosis present

## 2022-01-01 DIAGNOSIS — Z79899 Other long term (current) drug therapy: Secondary | ICD-10-CM

## 2022-01-01 DIAGNOSIS — W450XXA Nail entering through skin, initial encounter: Secondary | ICD-10-CM

## 2022-01-01 DIAGNOSIS — S8256XA Nondisplaced fracture of medial malleolus of unspecified tibia, initial encounter for closed fracture: Secondary | ICD-10-CM

## 2022-01-01 DIAGNOSIS — Z23 Encounter for immunization: Secondary | ICD-10-CM | POA: Diagnosis present

## 2022-01-01 DIAGNOSIS — Z86718 Personal history of other venous thrombosis and embolism: Secondary | ICD-10-CM

## 2022-01-01 DIAGNOSIS — F32A Depression, unspecified: Secondary | ICD-10-CM | POA: Diagnosis present

## 2022-01-01 DIAGNOSIS — S82891A Other fracture of right lower leg, initial encounter for closed fracture: Secondary | ICD-10-CM | POA: Diagnosis present

## 2022-01-01 DIAGNOSIS — F419 Anxiety disorder, unspecified: Secondary | ICD-10-CM | POA: Diagnosis present

## 2022-01-01 DIAGNOSIS — R7989 Other specified abnormal findings of blood chemistry: Secondary | ICD-10-CM

## 2022-01-01 DIAGNOSIS — Z79891 Long term (current) use of opiate analgesic: Secondary | ICD-10-CM | POA: Diagnosis not present

## 2022-01-01 DIAGNOSIS — S91331A Puncture wound without foreign body, right foot, initial encounter: Secondary | ICD-10-CM | POA: Diagnosis present

## 2022-01-01 DIAGNOSIS — T40411A Poisoning by fentanyl or fentanyl analogs, accidental (unintentional), initial encounter: Principal | ICD-10-CM

## 2022-01-01 DIAGNOSIS — I82409 Acute embolism and thrombosis of unspecified deep veins of unspecified lower extremity: Secondary | ICD-10-CM | POA: Diagnosis present

## 2022-01-01 DIAGNOSIS — S92411A Displaced fracture of proximal phalanx of right great toe, initial encounter for closed fracture: Secondary | ICD-10-CM | POA: Diagnosis present

## 2022-01-01 DIAGNOSIS — L089 Local infection of the skin and subcutaneous tissue, unspecified: Secondary | ICD-10-CM | POA: Diagnosis not present

## 2022-01-01 DIAGNOSIS — J9601 Acute respiratory failure with hypoxia: Secondary | ICD-10-CM | POA: Diagnosis present

## 2022-01-01 DIAGNOSIS — S92911A Unspecified fracture of right toe(s), initial encounter for closed fracture: Secondary | ICD-10-CM | POA: Diagnosis present

## 2022-01-01 DIAGNOSIS — G929 Unspecified toxic encephalopathy: Secondary | ICD-10-CM | POA: Diagnosis present

## 2022-01-01 DIAGNOSIS — M549 Dorsalgia, unspecified: Secondary | ICD-10-CM | POA: Diagnosis present

## 2022-01-01 DIAGNOSIS — R4189 Other symptoms and signs involving cognitive functions and awareness: Secondary | ICD-10-CM | POA: Diagnosis present

## 2022-01-01 DIAGNOSIS — Z20822 Contact with and (suspected) exposure to covid-19: Secondary | ICD-10-CM | POA: Diagnosis present

## 2022-01-01 LAB — URINE DRUG SCREEN, QUALITATIVE (ARMC ONLY)
Amphetamines, Ur Screen: POSITIVE — AB
Barbiturates, Ur Screen: NOT DETECTED
Benzodiazepine, Ur Scrn: POSITIVE — AB
Cannabinoid 50 Ng, Ur ~~LOC~~: NOT DETECTED
Cocaine Metabolite,Ur ~~LOC~~: NOT DETECTED
MDMA (Ecstasy)Ur Screen: NOT DETECTED
Methadone Scn, Ur: NOT DETECTED
Opiate, Ur Screen: POSITIVE — AB
Phencyclidine (PCP) Ur S: NOT DETECTED
Tricyclic, Ur Screen: NOT DETECTED

## 2022-01-01 LAB — CBC WITH DIFFERENTIAL/PLATELET
Abs Immature Granulocytes: 0.09 10*3/uL — ABNORMAL HIGH (ref 0.00–0.07)
Basophils Absolute: 0.1 10*3/uL (ref 0.0–0.1)
Basophils Relative: 1 %
Eosinophils Absolute: 0.1 10*3/uL (ref 0.0–0.5)
Eosinophils Relative: 1 %
HCT: 43.3 % (ref 39.0–52.0)
Hemoglobin: 14.8 g/dL (ref 13.0–17.0)
Immature Granulocytes: 1 %
Lymphocytes Relative: 14 %
Lymphs Abs: 1.8 10*3/uL (ref 0.7–4.0)
MCH: 33.2 pg (ref 26.0–34.0)
MCHC: 34.2 g/dL (ref 30.0–36.0)
MCV: 97.1 fL (ref 80.0–100.0)
Monocytes Absolute: 1 10*3/uL (ref 0.1–1.0)
Monocytes Relative: 8 %
Neutro Abs: 9.9 10*3/uL — ABNORMAL HIGH (ref 1.7–7.7)
Neutrophils Relative %: 75 %
Platelets: 309 10*3/uL (ref 150–400)
RBC: 4.46 MIL/uL (ref 4.22–5.81)
RDW: 13.4 % (ref 11.5–15.5)
WBC: 13 10*3/uL — ABNORMAL HIGH (ref 4.0–10.5)
nRBC: 0 % (ref 0.0–0.2)

## 2022-01-01 LAB — HIV ANTIBODY (ROUTINE TESTING W REFLEX): HIV Screen 4th Generation wRfx: NONREACTIVE

## 2022-01-01 LAB — BLOOD GAS, VENOUS
Acid-Base Excess: 1.1 mmol/L (ref 0.0–2.0)
Bicarbonate: 28.7 mmol/L — ABNORMAL HIGH (ref 20.0–28.0)
O2 Saturation: 52.5 %
Patient temperature: 37
pCO2, Ven: 57 mmHg (ref 44.0–60.0)
pH, Ven: 7.31 (ref 7.250–7.430)
pO2, Ven: 31 mmHg — CL (ref 32.0–45.0)

## 2022-01-01 LAB — BRAIN NATRIURETIC PEPTIDE: B Natriuretic Peptide: 6.8 pg/mL (ref 0.0–100.0)

## 2022-01-01 LAB — RESP PANEL BY RT-PCR (FLU A&B, COVID) ARPGX2
Influenza A by PCR: NEGATIVE
Influenza B by PCR: NEGATIVE
SARS Coronavirus 2 by RT PCR: NEGATIVE

## 2022-01-01 LAB — COMPREHENSIVE METABOLIC PANEL
ALT: 31 U/L (ref 0–44)
AST: 42 U/L — ABNORMAL HIGH (ref 15–41)
Albumin: 3.8 g/dL (ref 3.5–5.0)
Alkaline Phosphatase: 80 U/L (ref 38–126)
Anion gap: 8 (ref 5–15)
BUN: 20 mg/dL (ref 6–20)
CO2: 24 mmol/L (ref 22–32)
Calcium: 8.5 mg/dL — ABNORMAL LOW (ref 8.9–10.3)
Chloride: 106 mmol/L (ref 98–111)
Creatinine, Ser: 1.12 mg/dL (ref 0.61–1.24)
GFR, Estimated: >60 mL/min (ref >60)
Glucose, Bld: 199 mg/dL — ABNORMAL HIGH (ref 70–99)
Potassium: 3.9 mmol/L (ref 3.5–5.1)
Sodium: 138 mmol/L (ref 135–145)
Total Bilirubin: 0.9 mg/dL (ref 0.3–1.2)
Total Protein: 7.4 g/dL (ref 6.5–8.1)

## 2022-01-01 LAB — PROCALCITONIN: Procalcitonin: <0.1 ng/mL

## 2022-01-01 LAB — C-REACTIVE PROTEIN: CRP: 1.1 mg/dL — ABNORMAL HIGH (ref <1.0)

## 2022-01-01 LAB — SEDIMENTATION RATE: Sed Rate: 7 mm/hr (ref 0–20)

## 2022-01-01 LAB — LACTIC ACID, PLASMA: Lactic Acid, Venous: 2.3 mmol/L (ref 0.5–1.9)

## 2022-01-01 MED ORDER — VANCOMYCIN HCL 1500 MG/300ML IV SOLN
1500.0000 mg | Freq: Once | INTRAVENOUS | Status: AC
  Administered 2022-01-01: 1500 mg via INTRAVENOUS
  Filled 2022-01-01: qty 300

## 2022-01-01 MED ORDER — ROPINIROLE HCL 1 MG PO TABS
1.0000 mg | ORAL_TABLET | Freq: Every day | ORAL | Status: DC
  Filled 2022-01-01 (×2): qty 1

## 2022-01-01 MED ORDER — SODIUM CHLORIDE 0.9 % IV SOLN
2.0000 g | Freq: Three times a day (TID) | INTRAVENOUS | Status: DC
  Administered 2022-01-01 – 2022-01-02 (×2): 2 g via INTRAVENOUS
  Filled 2022-01-01 (×2): qty 2

## 2022-01-01 MED ORDER — NICOTINE 21 MG/24HR TD PT24
21.0000 mg | MEDICATED_PATCH | Freq: Every day | TRANSDERMAL | Status: DC
  Administered 2022-01-01: 21 mg via TRANSDERMAL
  Filled 2022-01-01 (×2): qty 1

## 2022-01-01 MED ORDER — ACETAMINOPHEN 325 MG PO TABS: 650.0000 mg | ORAL_TABLET | Freq: Four times a day (QID) | ORAL | Status: DC | PRN

## 2022-01-01 MED ORDER — AMPICILLIN-SULBACTAM SODIUM 3 (2-1) G IJ SOLR
3.0000 g | Freq: Once | INTRAMUSCULAR | Status: AC
  Administered 2022-01-01: 3 g via INTRAVENOUS
  Filled 2022-01-01: qty 8

## 2022-01-01 MED ORDER — ONDANSETRON HCL 4 MG/2ML IJ SOLN: 4.0000 mg | Freq: Three times a day (TID) | INTRAMUSCULAR | Status: DC | PRN

## 2022-01-01 MED ORDER — VANCOMYCIN HCL 1250 MG/250ML IV SOLN
1250.0000 mg | Freq: Two times a day (BID) | INTRAVENOUS | Status: DC
  Administered 2022-01-02: 1250 mg via INTRAVENOUS
  Filled 2022-01-01 (×2): qty 250

## 2022-01-01 MED ORDER — SERTRALINE HCL 50 MG PO TABS
100.0000 mg | ORAL_TABLET | Freq: Every day | ORAL | Status: DC
Start: 2022-01-01 — End: 2022-01-02
  Administered 2022-01-01 – 2022-01-02 (×2): 100 mg via ORAL
  Filled 2022-01-01 (×2): qty 2

## 2022-01-01 MED ORDER — AMPHETAMINE-DEXTROAMPHETAMINE 10 MG PO TABS
10.0000 mg | ORAL_TABLET | Freq: Two times a day (BID) | ORAL | Status: DC
  Administered 2022-01-02: 10 mg via ORAL
  Filled 2022-01-01: qty 1

## 2022-01-01 MED ORDER — AMPHETAMINE-DEXTROAMPHETAMINE 10 MG PO TABS: 10.0000 mg | ORAL_TABLET | Freq: Two times a day (BID) | ORAL | Status: DC

## 2022-01-01 MED ORDER — RIVAROXABAN 20 MG PO TABS
20.0000 mg | ORAL_TABLET | Freq: Every evening | ORAL | Status: DC
  Filled 2022-01-01 (×2): qty 1

## 2022-01-01 MED ORDER — VANCOMYCIN HCL IN DEXTROSE 1-5 GM/200ML-% IV SOLN
1000.0000 mg | Freq: Once | INTRAVENOUS | Status: AC
  Administered 2022-01-01: 1000 mg via INTRAVENOUS
  Filled 2022-01-01: qty 200

## 2022-01-01 MED ORDER — ALBUTEROL SULFATE (2.5 MG/3ML) 0.083% IN NEBU: 2.5000 mg | INHALATION_SOLUTION | RESPIRATORY_TRACT | Status: DC | PRN

## 2022-01-01 MED ORDER — TETANUS-DIPHTHERIA TOXOIDS TD 5-2 LFU IM INJ
0.5000 mL | INJECTION | Freq: Once | INTRAMUSCULAR | Status: AC
  Administered 2022-01-01: 0.5 mL via INTRAMUSCULAR
  Filled 2022-01-01: qty 0.5

## 2022-01-01 MED ORDER — PANTOPRAZOLE SODIUM 40 MG PO TBEC
40.0000 mg | DELAYED_RELEASE_TABLET | Freq: Every day | ORAL | Status: DC
  Administered 2022-01-02: 40 mg via ORAL
  Filled 2022-01-01: qty 1

## 2022-01-01 MED ORDER — DEXTROSE 5 % IV SOLN
1.0000 mg/h | INTRAVENOUS | Status: DC
  Administered 2022-01-01 (×2): 1 mg/h via INTRAVENOUS
  Filled 2022-01-01: qty 10
  Filled 2022-01-01: qty 4

## 2022-01-01 MED ORDER — SODIUM CHLORIDE 0.9 % IV SOLN: INTRAVENOUS | Status: DC

## 2022-01-01 MED ORDER — LACTATED RINGERS IV BOLUS
1000.0000 mL | Freq: Once | INTRAVENOUS | Status: AC
  Administered 2022-01-01: 1000 mL via INTRAVENOUS

## 2022-01-01 NOTE — H&P (Signed)
History and Physical    SHEDDRICK ARRONA [id number] DOB: 1966/01/27 DOA: 01/01/2022  Referring MD/NP/PA:   PCP: Patient, No Pcp Per (Inactive)   Patient coming from:  The patient is coming from home.     Chief Complaint: Unresponsiveness  HPI: ZEVEN STEWARDSON is a 56 y.o. male with medical history significant of chronic pain syndrome, chronic narcotic dependence (patient still taking Suboxone, oxycodone, Percocet), DVT on Xarelto, GERD, depression with anxiety, who presents with unresponsiveness.  Per report, pt stepped on a rusty nail today. He injured his right foot. Pt has puncture wound to R plantar surface. He also has pain in right ankle. He snorted a white-grey powder for pain relief, then became unresponsiveness.  Wife did CPR, then first responders arrived and gave rescue breaths only. Pt was given 6mg  Narcan IN given with little response. Then 1mg  Narcan was given IV and pt became more alert, but then he becomes less responsive again. Narcan gtt is started in ED. patient responded to Narcan drip. He becomes fully alert, and orientated x3 when I saw patient in ED.  Patient denies chest pain, cough, shortness of breath.  Denies nausea, vomiting, diarrhea or abdominal pain.  No symptoms of UTI. Of note, patient has chronic pain syndrome. He currently is taking Subutex 2 mg twice daily as needed, oxycodone 15 mg 5 times a day, Percocet 5-325 mg 4 times per day as needed.  ED Course: pt was found to have WBC 13.0, BNP 6.8, negative COVID PCR, GFR > 60, temperature normal, blood pressure 100/65, heart rate 93, 84, RR 23, oxygen saturation 87 on room air initially, which improved to 94% on room air after woke up.  Chest x-ray showed low volume and vascular congestion.  CT head is negative for acute intracranial abnormalities.  Patient is admitted to stepdown as inpatient.  Dr. Mortimer Fries of ICU is consulted.  X-ray of right ankle: Essentially nondisplaced fracture through the distal aspect  of the medial malleolus.  X-ray of right foot 1. Small, minimally displaced fracture through the far medial aspect of the base of the 1st proximal phalanx extending into the 1st MTP joint. 2. Diffuse soft tissue swelling in the plantar aspect of the distal foot.  EKG: I have personally reviewed.  Sinus rhythm, QTC 454, Q wave in lead III/aVF, voltage, nonspecific T wave change  Review of Systems:   General: no fevers, chills, no body weight gain, has fatigue HEENT: no blurry vision, hearing changes or sore throat Respiratory: no dyspnea, coughing, wheezing CV: no chest pain, no palpitations GI: no nausea, vomiting, abdominal pain, diarrhea, constipation GU: no dysuria, burning on urination, increased urinary frequency, hematuria  Ext: no leg edema. Has right foot and right ankle pain. Neuro: no unilateral weakness, numbness, or tingling, no vision change or hearing loss. Has AMS Skin: no rash, no skin tear. MSK: No muscle spasm, no deformity, no limitation of range of movement in spin Heme: No easy bruising.  Travel history: No recent long distant travel.   Allergy: No Known Allergies  Past Medical History:  Diagnosis Date   Blood clotting disorder (HCC)    Chronic anticoagulation    Chronic back pain    Chronic, continuous use of opioids    History of DVT (deep vein thrombosis)    Nicotine dependence     Past Surgical History:  Procedure Laterality Date   BACK SURGERY     ORIF ACETABULAR FRACTURE Left 09/01/2020   Procedure: OPEN REDUCTION INTERNAL FIXATION (  ORIF) ACETABULAR FRACTURE. ORIF Left posterior wall acetabulum fracture;  Surgeon: Altamese Burke, MD;  Location: [facility];  Service: Orthopedics;  Laterality: Left;   SPLENECTOMY     SPLENECTOMY      Social History:  reports that he has been smoking. He has never used smokeless tobacco. He reports current alcohol use. He reports current drug use.  Family History:  Family History  Problem Relation Age of Onset    Alcoholism Father      Prior to Admission medications   Medication Sig Start Date End Date Taking? Authorizing Provider  acetaminophen (TYLENOL) 500 MG tablet Take 2 tablets (1,000 mg total) by mouth every 6 (six) hours as needed. 09/05/20   Saverio Danker, PA-C  cephALEXin (KEFLEX) 500 MG capsule Take 1 capsule (500 mg total) by mouth 3 (three) times daily. 10/08/20   Harvest Dark, MD  gabapentin (NEURONTIN) 300 MG capsule Take 1 capsule (300 mg total) by mouth 3 (three) times daily. 09/05/20   Saverio Danker, PA-C  methocarbamol (ROBAXIN) 500 MG tablet Take 2 tablets (1,000 mg total) by mouth 3 (three) times daily. 09/05/20   Saverio Danker, PA-C  Multiple Vitamin (MULTIVITAMIN) tablet Take 1 tablet by mouth daily.    [provider]  omeprazole (PRILOSEC) 40 MG capsule Take 40 mg by mouth daily. 08/03/20   [provider]  oxyCODONE 10 MG TABS Take 1-1.5 tablets (10-15 mg total) by mouth every 4 (four) hours as needed for moderate pain or severe pain (10mg  for moderate pain, 15mg  for severe pain). 09/05/20   Saverio Danker, PA-C  oxyCODONE-acetaminophen (PERCOCET/ROXICET) 5-325 MG per tablet Take 1 tablet by mouth every 4 (four) hours as needed for severe pain. 05/26/15   Gregor Hams, MD  rOPINIRole (REQUIP) 1 MG tablet Take 1 mg by mouth at bedtime. 07/06/20   [provider]  sulfamethoxazole-trimethoprim (BACTRIM DS) 800-160 MG tablet Take 1 tablet by mouth 2 (two) times daily. 10/08/20   Harvest Dark, MD  triamcinolone (KENALOG) 0.025 % cream Apply 1 application topically 2 (two) times daily.    [provider]  XARELTO 20 MG TABS tablet Take 20 mg by mouth daily. 08/06/20   [provider]    Physical Exam: Vitals:   01/01/22 1600 01/01/22 1630 01/01/22 1700 01/01/22 1730  BP: 121/72 114/66 118/77 129/75  Pulse: 94 79 73 76  Resp: (!) 23 (!) 25 20 (!) 23  Temp:      TempSrc:      SpO2: 90% 92% 100% 99%  Weight:      Height:        General: Not in acute distress HEENT:       Eyes: PERRL, EOMI, no scleral icterus.       ENT: No discharge from the ears and nose, no pharynx injection, no tonsillar enlargement.        Neck: No JVD, no bruit, no mass felt. Heme: No neck lymph node enlargement. Cardiac: S1/S2, RRR, No murmurs, No gallops or rubs. Respiratory: No rales, wheezing, rhonchi or rubs. GI: Soft, nondistended, nontender, no rebound pain, no organomegaly, BS present. GU: No hematuria Ext: No pitting leg edema bilaterally. 1+DP/PT pulse bilaterally. Has tenderness in right ankle and a small deep puncture wound area at the plantar area     Musculoskeletal: No joint deformities, No joint redness or warmth, no limitation of ROM in spin. Skin: No rashes.  Neuro: Alert, oriented X3, cranial nerves II-XII grossly intact, moves all extremities normally. Psych: Patient  is not psychotic, no suicidal or hemocidal ideation.  Labs on Admission: I have personally reviewed following labs and imaging studies  CBC: Recent Labs  Lab 01/01/22 1139  WBC 13.0*  NEUTROABS 9.9*  HGB 14.8  HCT 43.3  MCV 97.1  PLT [id number]   Basic Metabolic Panel: Recent Labs  Lab 01/01/22 1139  NA 138  K 3.9  CL 106  CO2 24  GLUCOSE 199*  BUN 20  CREATININE 1.12  CALCIUM 8.5*   GFR: Estimated Creatinine Clearance: 98.9 mL/min (by C-G formula based on SCr of 1.12 mg/dL). Liver Function Tests: Recent Labs  Lab 01/01/22 1139  AST 42*  ALT 31  ALKPHOS 80  BILITOT 0.9  PROT 7.4  ALBUMIN 3.8   No results for input(s): LIPASE, AMYLASE in the last 168 hours. No results for input(s): AMMONIA in the last 168 hours. Coagulation Profile: No results for input(s): INR, PROTIME in the last 168 hours. Cardiac Enzymes: No results for input(s): CKTOTAL, CKMB, CKMBINDEX, TROPONINI in the last 168 hours. BNP (last 3 results) No results for input(s): PROBNP in the last 8760 hours. HbA1C: No results for input(s): HGBA1C in the last 72  hours. CBG: No results for input(s): GLUCAP in the last 168 hours. Lipid Profile: No results for input(s): CHOL, HDL, LDLCALC, TRIG, CHOLHDL, LDLDIRECT in the last 72 hours. Thyroid Function Tests: No results for input(s): TSH, T4TOTAL, FREET4, T3FREE, THYROIDAB in the last 72 hours. Anemia Panel: No results for input(s): VITAMINB12, FOLATE, FERRITIN, TIBC, IRON, RETICCTPCT in the last 72 hours. Urine analysis: No results found for: COLORURINE, APPEARANCEUR, LABSPEC, PHURINE, GLUCOSEU, HGBUR, BILIRUBINUR, KETONESUR, PROTEINUR, UROBILINOGEN, NITRITE, LEUKOCYTESUR Sepsis Labs: @LABRCNTIP (procalcitonin:4,lacticidven:4) ) Recent Results (from the past 240 hour(s))  Resp Panel by RT-PCR (Flu A&B, Covid) Nasopharyngeal Swab     Status: None   Collection Time: 01/01/22  2:50 PM   Specimen: Nasopharyngeal Swab; Nasopharyngeal(NP) swabs in vial transport medium  Result Value Ref Range Status   SARS Coronavirus 2 by RT PCR NEGATIVE NEGATIVE Final    Comment: (NOTE) SARS-CoV-2 target nucleic acids are NOT DETECTED.  The SARS-CoV-2 RNA is generally detectable in upper respiratory specimens during the acute phase of infection. The lowest concentration of SARS-CoV-2 viral copies this assay can detect is 138 copies/mL. A negative result does not preclude SARS-Cov-2 infection and should not be used as the sole basis for treatment or other patient management decisions. A negative result may occur with  improper specimen collection/handling, submission of specimen other than nasopharyngeal swab, presence of viral mutation(s) within the areas targeted by this assay, and inadequate number of viral copies(<138 copies/mL). A negative result must be combined with clinical observations, patient history, and epidemiological information. The expected result is Negative.  Fact Sheet for Patients:  EntrepreneurPulse.com.au  Fact Sheet for Healthcare Providers:   IncredibleEmployment.be  This test is no t yet approved or cleared by the [place] FDA and  has been authorized for detection and/or diagnosis of SARS-CoV-2 by FDA under an Emergency Use Authorization (EUA). This EUA will remain  in effect (meaning this test can be used) for the duration of the COVID-19 declaration under Section 564(b)(1) of the Act, 21 U.S.C.section 360bbb-3(b)(1), unless the authorization is terminated  or revoked sooner.       Influenza A by PCR NEGATIVE NEGATIVE Final   Influenza B by PCR NEGATIVE NEGATIVE Final    Comment: (NOTE) The Xpert Xpress SARS-CoV-2/FLU/RSV plus assay is intended as an aid in the diagnosis of influenza from Nasopharyngeal  swab specimens and should not be used as a sole basis for treatment. Nasal washings and aspirates are unacceptable for Xpert Xpress SARS-CoV-2/FLU/RSV testing.  Fact Sheet for Patients: BloggerCourse.com  Fact Sheet for Healthcare Providers: SeriousBroker.it  This test is not yet approved or cleared by the [place] FDA and has been authorized for detection and/or diagnosis of SARS-CoV-2 by FDA under an Emergency Use Authorization (EUA). This EUA will remain in effect (meaning this test can be used) for the duration of the COVID-19 declaration under Section 564(b)(1) of the Act, 21 U.S.C. section 360bbb-3(b)(1), unless the authorization is terminated or revoked.  Performed at [facility], [address] [place], [place] [address]      Radiological Exams on Admission: DG Ankle Complete Right  Result Date: 01/01/2022 CLINICAL DATA:  Pain in the plantar aspect of the distal foot and at the medial malleolus of the ankle with associated tenderness at the medial malleolus since stepping on a nail. EXAM: RIGHT ANKLE - COMPLETE 3+ VIEW COMPARISON:  Right foot radiographs obtained at the same time. FINDINGS: Transverse fracture  through the distal aspect of the medial malleolus without significant displacement. Small adjacent corticated ossicles. There is evidence of an associated small effusion. Moderate inferior and minimal posterior calcaneal enthesophyte formation. Minimal dorsal tarsal spur formation. IMPRESSION: Essentially nondisplaced fracture through the distal aspect of the medial malleolus. Electronically Signed   By: Beckie Salts M.D.   On: 01/01/2022 12:35   CT HEAD WO CONTRAST ( )  Result Date: 01/01/2022 CLINICAL DATA:  Mental status change, unknown cause EXAM: CT HEAD WITHOUT CONTRAST TECHNIQUE: Contiguous axial images were obtained from the base of the skull through the vertex without intravenous contrast. RADIATION DOSE REDUCTION: This exam was performed according to the departmental dose-optimization program which includes automated exposure control, adjustment of the mA and/or kV according to patient size and/or use of iterative reconstruction technique. COMPARISON:  08/30/2020. FINDINGS: Brain: Encephalomalacia in the high right parietal lobe, unchanged. 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 No evidence of acute large vascular territory infarct. No hydrocephalus, acute hemorrhage, mass lesion, midline shift. Vascular: No hyperdense vessel identified. Skull: No acute fracture. Sinuses/Orbits: Air-fluid level in the right sphenoid sinus and retention cyst versus polyps in the left maxillary sinus. Other: Small right mastoid effusion. IMPRESSION: 1. No evidence of acute intracranial abnormality. 2. Similar right parietal encephalomalacia and remote left basal ganglia lacunar infarcts. Electronically Signed   By: Feliberto Harts M.D.   On: 01/01/2022 14:32   DG Chest Portable 1 View  Result Date: 01/01/2022 CLINICAL DATA:  Elevated lactic acid and white blood cell count. Fentanyl overdose. Evaluate for aspiration. EXAM: PORTABLE CHEST 1 VIEW COMPARISON:  08/30/2020 FINDINGS: Heart size appears  normal. No pleural effusion or edema. Lung volumes are low. No airspace opacities. Pulmonary vascular congestion identified. Visualized osseous structures appear intact. IMPRESSION: 1. Low lung volumes and pulmonary vascular congestion. 2. No airspace opacities identified. Electronically Signed   By: Signa Kell M.D.   On: 01/01/2022 14:04   DG Foot Complete Right  Result Date: 01/01/2022 CLINICAL DATA:  Stepped on a nail at the base of the toes. Medial ankle pain and tenderness at the base of the medial malleolus since the injury. EXAM: RIGHT FOOT COMPLETE - 3+ VIEW COMPARISON:  Right ankle radiographs obtained at the same time. FINDINGS: Distal soft tissue swelling in the plantar aspect of the foot. Small fracture through the far medial aspect of the base of the 1st proximal  phalanx extending into the 1st MTP joint. Minimal proximal displacement of the medial fragment. No radiopaque foreign body, soft tissue gas, bone destruction or periosteal reaction. Moderate-sized inferior calcaneal enthesophyte. IMPRESSION: 1. Small, minimally displaced fracture through the far medial aspect of the base of the 1st proximal phalanx extending into the 1st MTP joint. 2. Diffuse soft tissue swelling in the plantar aspect of the distal foot. Electronically Signed   By: Claudie Revering M.D.   On: 01/01/2022 12:31      Assessment/Plan Principal Problem:   Unresponsiveness Active Problems:   Chronic pain syndrome   Closed right ankle fracture   Toe fracture, right   DVT (deep venous thrombosis) (HCC)   Acute respiratory failure with hypoxia (HCC)   Depression with anxiety   Unresponsiveness: CT head is negative for acute intracranial abnormalities.  Patient responded to Narcan treatment nicely.  Suspecting narcotic overdose. He currently is taking Subutex 2 mg twice daily as needed, oxycodone 15 mg 5 times a day, Percocet 5-325 mg 4 times per day as needed.  Dr. Mortimer Fries of ICU is consulted  -Admitted to stepdown as  inpatient -Frequent neuro check -Hold home narcotics -Narcan drip for now -Per Dr. Mortimer Fries, if patient develop withdrawal, will only restart oxycodone 15 mg as needed -check UDS  Chronic pain syndrome -Hold home narcotic medications  Right foot puncture wound, closed right ankle fracture, Toe fracture, right: consulted Dr. Maye Hides of podiatry -Broad antibiotics per Dr. Blenda Mounts -->vancomycin and cefepime (patient received 1 dose of Unasyn in ED) -Tetanus prophylaxis: Patient was given tetanus/diphtheria toxoid - f/u Blood culture  DVT (deep venous thrombosis) ([place]) -Xarelto  Acute respiratory failure with hypoxia ([place]): Resolved.  Most likely due to narcotic overdose -As needed albuterol  Depression with anxiety -Hold Valium -Continue home Requip, Zoloft      DVT ppx: on Xarelto Code Status: Full code Family Communication:  Yes, patient's wife by phone Disposition Plan:  Anticipate discharge back to previous environment Consults called:  Dr. Mortimer Fries of ICU and Dr. Maye Hides of podiatry Admission status and Level of care: Stepdown:    as inpt           Status is: Inpatient  Remains inpatient appropriate because: Patient has multiple comorbidities, including chronic pain syndrome on chronic narcotic medications, now presents with possible narcotic overdose and unresponsiveness.  Patient is Narcan drip treatment.  Patient also has right foot punctuated only injury and right ankle fracture.  His presentation is highly complicated.  Patient is at high risk of deteriorating.  Need to be treated in hospital for at least 2 days.          Date of Service 01/01/2022    Ivor Costa [facility]   If 7PM-7AM, please contact night-coverage [email or web address] 01/01/2022, 7:25 PM

## 2022-01-01 NOTE — ED Notes (Signed)
EDP saw pt at bedside. See new orders.

## 2022-01-01 NOTE — ED Notes (Signed)
Per verbal order from pulmonologist and Dr Belia Heman, stop narcan drip for now and see if pt stays awake. If need to restart, start at .5mg /hour rather than 1mg /hr.  Informed providers that pt has been easily arousable but falls to sleep immediately after waking up, and that pt was placed on 2L oxygen because was desatting with sleep. They still want pt to come off Narcan drip for now.

## 2022-01-01 NOTE — ED Notes (Signed)
Pt mostly sleeping now. 2L oxygen placed on pt. Pt rouses to voice but falls right back asleep. SPO2 88%-92% while sleeping and rapidly rises to 94% when awake. EDP informed.

## 2022-01-01 NOTE — ED Notes (Signed)
Abx were given prior. Now there are orders for blood cultures. Will draw.

## 2022-01-01 NOTE — ED Triage Notes (Signed)
Pt to ED [facility] from home  Pt snorted a white-grey powder about 1 hour ago, wife found him unresponsive on floor. Wife started CPR on pt, then first responders arrived and gave rescue breaths only.  6mg  Narcan IN given with little response. Then 1mg  Narcan given IV and pt became alert. Stable VS  Pt also stepped on rusty nail this morning, R foot is bandaged currently. Pt states he snorted drugs because foot was hurting after stepping on nail.  Pt is currently alert and responsive. EDP at bedside. Pt has puncture wound to R plantar surface of foot.

## 2022-01-01 NOTE — ED Provider Notes (Signed)
[facility] Provider Note    Event Date/Time   First MD Initiated Contact with Patient 01/01/22 1127     (approximate)   History   Drug Overdose   HPI  Jonathan Rich is a 56 y.o. male with a chronic clotting disorder and splenectomy who stepped on a nail earlier today.  He said somebody came by and gave him some white powder that was supposed to help him with the pain of stepping on a nail and he passed out.  EMS was called reportedly got intranasal Narcan x6 and 1 IV dose before he woke up.  He is now awake and alert reports that his foot was hurting a lot and still hurting a little bit.  He has some pain at the base of the medial malleolus and that ankle.  He has no other problems.   Outpatient Medications acetaminophen (TYLENOL) 500 MG tablet cephALEXin (KEFLEX) 500 MG capsule gabapentin (NEURONTIN) 300 MG capsule methocarbamol (ROBAXIN) 500 MG tablet Multiple Vitamin (MULTIVITAMIN) tablet omeprazole (PRILOSEC) 40 MG capsule oxyCODONE 10 MG TABS oxyCODONE-acetaminophen (PERCOCET/ROXICET) 5-325 MG per tablet rOPINIRole (REQUIP) 1 MG tablet sulfamethoxazole-trimethoprim (BACTRIM DS) 800-160 MG tablet triamcinolone (KENALOG) 0.025 % cream XARELTO 20 MG TABS tablet   Physical Exam   Triage Vital Signs: ED Triage Vitals  Enc Vitals Group     BP 01/01/22 1134 (!) 127/114     Pulse Rate 01/01/22 1134 93     Resp 01/01/22 1134 19     Temp 01/01/22 1134 97.9 F (36.6 C)     Temp Source 01/01/22 1134 Oral     SpO2 01/01/22 1134 90 %     Weight 01/01/22 1135 271 lb 6.2 oz (123.1 kg)     Height 01/01/22 1135 5\' 9"  (1.753 m)     Head Circumference --      Peak Flow --      Pain Score 01/01/22 1134 0     Pain Loc --      Pain Edu? --      Excl. in GC? --     Most recent vital signs: Vitals:   01/01/22 1300 01/01/22 1315  BP:  100/65  Pulse: 84 84  Resp: (!) 23 18  Temp:    SpO2: (!) 87% 90%     General: Awake, no distress.  Head  normocephalic atraumatic Eyes pupils equal round reactive to light extraocular movements intact pupils are small but not pinpoint CV:  Good peripheral perfusion.  Heart regular rate and rhythm no audible murmurs Resp:  Normal effort.  Lungs are clear Abd:  No distention.  Soft and nontender Extremities: No edema, there is a puncture wound in the sole of the right foot.  There is no swelling around it and there is swelling on the dorsal surface.  The ankle has some tenderness at the base and medial malleolus distally.  No other pain or swelling.   ED Results / Procedures / Treatments   Labs (all labs ordered are listed, but only abnormal results are displayed) Labs Reviewed  COMPREHENSIVE METABOLIC PANEL - Abnormal; Notable for the following components:      Result Value   Glucose, Bld 199 (*)    Calcium 8.5 (*)    AST 42 (*)    All other components within normal limits  CBC WITH DIFFERENTIAL/PLATELET - Abnormal; Notable for the following components:   WBC 13.0 (*)    Neutro Abs 9.9 (*)    Abs Immature Granulocytes  0.09 (*)    All other components within normal limits  URINE DRUG SCREEN, QUALITATIVE ([facility] ONLY)  PROCALCITONIN     EKG  EKG read interpreted by me shows normal sinus rhythm rate of 91 normal axis no acute ST-T wave changes   RADIOLOGY X-ray of the foot shows a small really nondisplaced fracture of the proximal phalanx of the great toe medially extending into the joint.  X-ray of the this is read by radiology reviewed by me.  X-ray of the ankle shows a small really nondisplaced fracture of the very end of the distal malleolus possible avulsion fracture radiology has not read that film yet.  PROCEDURES:  Critical Care performed:   Procedures   MEDICATIONS ORDERED IN ED: Medications  Ampicillin-Sulbactam (UNASYN) 3 g in sodium chloride 0.9 % 100 mL IVPB (3 g Intravenous New Bag/Given 01/01/22 1317)  naloxone HCl (NARCAN) 4 mg in dextrose 5 % 250 mL infusion (has  no administration in time range)  lactated ringers bolus 1,000 mL (has no administration in time range)     IMPRESSION / MDM / ASSESSMENT AND PLAN / ED COURSE  I reviewed the triage vital signs and the nursing notes. ----------------------------------------- 1:28 PM on 01/01/2022 ----------------------------------------- Patient was initially awake and alert and oriented.  He has now become very sleepy.  He arouses briefly to voice and goes back to sleep again.  He will desat into the 80s when he is asleep.  Blood pressure is being kept up with oxygen.  I will start him on a Narcan drip.  He will need to be watched.  X-ray shows some swelling of the foot fracture of the medial malleolus tip distally and medial tip of the phalanx.  He says both of these areas became painful after stepping on a nail.  Patient's white blood count is up his lactic acid just came back elevated.  He is a splenectomy patient I have ordered Unasyn for him earlier but I think I will change this to Timentin.  We will give him some IV fluids as well as his pressures come down somewhat.  Patient has at least a fentanyl overdose with some minor foot and ankle fractures which can be treated with a stiff shoe and Aircast.  Additionally he has the nail injury.  I am getting a chest x-ray as well in case he has aspirated when he was unconscious from his fentanyl. The patient is on the cardiac monitor to evaluate for evidence of arrhythmia and/or significant heart rate changes.  None have been seen  ----------------------------------------- 4:35 PM on 01/01/2022 ----------------------------------------- Patient had become more more sedated was difficult to wake up even with voice.  Patient's O2 sats were dropping and his blood pressure as well.  I have ordered a Narcan drip to be started.  I discussed him with Dr. Clyde Lundborg, the hospitalist.  He asked if I could call ICU.  I have done that and ICU will come down to see the gentleman as  well.  I am somewhat worried about the foot wound.  I had understood he had an elevated lactic acid but apparently this is not actually true.  His white count is however elevated and there is some redness and swelling on the foot.  I am worried about this because of his history of splenectomy.  I am also worried about this because it penetrated through his shoe which is very analogous to a tennis shoe.      FINAL CLINICAL IMPRESSION(S) / ED  DIAGNOSES   Final diagnoses:  Accidental fentanyl overdose, initial encounter (HCC)  Nondisplaced fracture of medial malleolus of unspecified tibia, initial encounter for closed fracture  Puncture wound of right foot, initial encounter  Elevated lactic acid level  History of splenectomy     Rx / DC Orders   ED Discharge Orders     None        Note:  This document was prepared using Dragon voice recognition software and may include unintentional dictation errors.   Arnaldo Natal, MD 01/01/22 [phone]

## 2022-01-01 NOTE — ED Notes (Signed)
Sent msg to pharmacy to request narcan drip asap.

## 2022-01-01 NOTE — ED Notes (Signed)
Spoke with wife Herbert Seta and gave update.

## 2022-01-01 NOTE — Consult Note (Signed)
Pharmacy Antibiotic Note  Jonathan Rich is a 56 y.o. male admitted on 01/01/2022 with  Wound infection .  Pharmacy has been consulted for Vancomycin and Cefepime dosing.  Plan: Cefepime 2g IV x1; followed by cefepime IV 2g q8h. Vancomycin 2.5g (1g+1.5g) IV now, then vancomycin 1.25g IV q12hr  Goal AUC 400-550  Est AUC: 469.7; Cmax: 31.5; Cmin: 12.7 SCr 1.12; AdjBW (BMI~40); Vd 0.5  F/u MRSA PCR ordered & cultures.   Height: 5\' 9"  (175.3 cm) Weight: 123.1 kg (271 lb 6.2 oz) IBW/kg (Calculated) : 70.7  Temp (24hrs), Avg:97.9 F (36.6 C), Min:97.9 F (36.6 C), Max:97.9 F (36.6 C)  Recent Labs  Lab 01/01/22 1139 01/01/22 1731  WBC 13.0*  --   CREATININE 1.12  --   LATICACIDVEN  --  2.3*    Estimated Creatinine Clearance: 98.9 mL/min (by C-G formula based on SCr of 1.12 mg/dL).    No Known Allergies  Antimicrobials this admission: Unasyn x1 in ED (1/30) VAN/CFP (1/30 >>   Dose adjustments this admission: CTM and adjust PRN.  Microbiology results: 1/30 Bcx: sent 1/30 MRSA PCR: ordered 1/30 Cov/Flu: neg/neg  Thank you for allowing pharmacy to be a part of this patients care.  2/30, PharmD, [facility] Clinical Pharmacist 01/01/2022 6:53 PM

## 2022-01-01 NOTE — Consult Note (Signed)
Subjective:  Patient ID: Jonathan Rich, male    DOB: [birth year],  MRN: [id number]  Patient with past medical history of clotting disorder and splenectomy seen at beside today for concern of puncture wound. Relates this morning he stepped on a nail that went through his shoe. He had immediate pain and reportedly took a white powder and passed out. He was brought to the ED for drug overdose and has been on Narcan. Is alert when talking him this evening. Currently not having much pain in the foot. Endorses some pain to his ankle. Denies nausea, vomiting, fever and chills.   Past Medical History:  Diagnosis Date   Blood clotting disorder (HCC)    Chronic anticoagulation    Chronic back pain    Chronic, continuous use of opioids    History of DVT (deep vein thrombosis)    Nicotine dependence      Past Surgical History:  Procedure Laterality Date   BACK SURGERY     ORIF ACETABULAR FRACTURE Left 09/01/2020   Procedure: OPEN REDUCTION INTERNAL FIXATION (ORIF) ACETABULAR FRACTURE. ORIF Left posterior wall acetabulum fracture;  Surgeon: Altamese [place], MD;  Location: [place];  Service: Orthopedics;  Laterality: Left;   SPLENECTOMY     SPLENECTOMY      CBC Latest Ref Rng & Units 01/01/2022 10/07/2020 09/03/2020  WBC 4.0 - 10.5 K/uL 13.0(H) 10.2 17.4(H)  Hemoglobin 13.0 - 17.0 g/dL 14.8 13.4 11.2(L)  Hematocrit 39.0 - 52.0 % 43.3 39.6 33.9(L)  Platelets 150 - 400 K/uL 309 401(H) 270    BMP Latest Ref Rng & Units 01/01/2022 10/07/2020 09/03/2020  Glucose 70 - 99 mg/dL 199(H) 116(H) 146(H)  BUN 6 - 20 mg/dL 20 7 13   Creatinine 0.61 - 1.24 mg/dL 1.12 0.73 0.73  Sodium 135 - 145 mmol/L 138 143 138  Potassium 3.5 - 5.1 mmol/L 3.9 3.9 3.2(L)  Chloride 98 - 111 mmol/L 106 106 107  CO2 22 - 32 mmol/L 24 27 23   Calcium 8.9 - 10.3 mg/dL 8.5(L) 8.4(L) 8.0(L)     Objective:   Vitals:   01/01/22 1700 01/01/22 1730  BP: 118/77 129/75  Pulse: 73 76  Resp: 20 (!) 23  Temp:    SpO2: 100% 99%     General:AA&O x 3. Normal mood and affect   Vascular: DP and PT pulses 2/4 bilateral. Brisk capillary refill to all digits. Pedal hair present   Neruological. Epicritic sensation grossly intact.   Derm: 0.1 cm puncture wound with granular base. No erythema or edema present. No purulence. No probe to bone. Interspaces clears of maceration. Nails well groomed and normal in appearance  MSK: MMT 5/5 in dorsiflexion, plantar flexion, inversion and eversion. Normal joint ROM without pain or crepitus. Tenderness to medial malleolus. No tenderness along first MPJ, no pain with ROM.          Assessment & Plan:  Patient was evaluated and treated and all questions answered.  DX: Right foot puncture wound, right medial malleolus avulsion fracture, right first proximal phalanx fracture  Wound care: neosporin, bandaid.  Antibiotics: Per primary. Recommend a week of broad spectrum antibiotics and tetanus prophylaxis DME: CAM boot   Discussed with patient diagnosis and treatment options.  Imaging reviewed. Chip fracture of the right medial malleolus non-displaced. Non displaced fracture of the base of the first proximal phalanx intraarticular. No osseous erosions noted.  Discussed diagnosis and treatment course with patient. Recommend off-loading in CAM boot and neosporin and DSD to the punture wound.  Patient in agreement with plan and all questions answered.  Will follow-up with podiatry in clinic upon discharge.   Lorenda Peck, MD  Accessible via secure chat for questions or concerns.

## 2022-01-01 NOTE — ED Notes (Signed)
Placed pt on ETCO2 monitoring.

## 2022-01-01 NOTE — Consult Note (Signed)
NAME:  Jonathan Rich, MRN:  [id number], DOB:  08-12-1966, LOS: 0 ADMISSION DATE:  01/01/2022, CONSULTATION DATE: 01/01/2022 REFERRING MD: Dr. Blaine Hamper, CHIEF COMPLAINT: Unresponsiveness    History of Present Illness:  This is a 56 yo male who presented to [facility] ER on 01/30 via EMS after being found unresponsive on the floor.  Per ER notes the pt stepped on a rusty nail the morning of 01/30, therefore to treat the pain he snorted an unknown grey powdery substance someone gave him.  Following ingestion of the substance he became unresponsive 1 hour later.  EMS notified and pts wife initiated CPR while awaiting their arrival.  Upon first responders arrival pt only required rescue breaths.  He received 6 mg of narcan intranasally with little response, therefore EMS administered 1 mg of IV narcan and the pt became more alert with stable vital signs.    ED course Upon arrival to the ER pt initially awake and alert with c/o pain at the right foot/ankle.  Right foot x-ray revealed small minimally displaced fracture through the far medial aspect of the base of the 1st proximal phalanx extending into the 1st MTP joint; diffuse soft tissue swelling in the plantar aspect of the distal foot.  X-ray of the right ankle revealed nondisplaced fracture through the distal aspect of the medial malleolus.  While in the ER pts mentation declined due to intermittent periods of severe lethargy along with hypoxia O2 sats in the 80's.  Per ED provider narcan gtt initiated.  Pt admitted to the stepdown unit per hospitalist team, but remained in the ER pending bed availability.  PCCM team consulted to assist with management.   Pertinent  Medical History  Blood clotting disorder~on chronic xarelto DVT Chronic Back Pain Former Smoker Chronic Opioid Use   Significant Hospital Events: Including procedures, antibiotic start and stop dates in addition to other pertinent events   01/30: Pt admitted to the stepdown unit with acute  encephalopathy following ingestion of an unknown grey powdery substance requiring narcan gtt   Significant Test Results:  CT Head 01/30>>No evidence of acute intracranial abnormality. Similar right parietal encephalomalacia and remote left basal ganglia lacunar infarcts. Right foot x-ray 01/30>> revealed small minimally displaced fracture through the far medial aspect of the base of the 1st proximal phalanx extending into the 1st MTP joint; diffuse soft tissue swelling in the plantar aspect of the distal foot Right ankle x-ray 01/30>>revealed nondisplaced fracture through the distal aspect of the medial malleolus  Interim History / Subjective:  Pt lethargic but easily arousable and able to follow commands on narcan gtt @1  mg/hr.  He remains on 2L O2 via nasal canula wit O2 sats @99 %.   Objective   Blood pressure 124/82, pulse 92, temperature 97.9 F (36.6 C), temperature source Oral, resp. rate 20, height 5\' 9"  (1.753 m), weight 123.1 kg, SpO2 91 %.        Intake/Output Summary (Last 24 hours) at 01/01/2022 1634 Last data filed at 01/01/2022 1400 Gross per 24 hour  Intake 100 ml  Output --  Net 100 ml   Filed Weights   01/01/22 1135  Weight: 123.1 kg    Examination: General: acutely ill appearing male, NAD resting in bed  HENT: supple, no JVD  Lungs: clear throughout, even, non labored  Cardiovascular: nsr, rrr, no R/G Abdomen: +BS x4, soft, non tender, non distended  Extremities: normal tone, 1+ right ankle edema; right foot wound see below:     Neuro: lethargic,  follows commands, PERRLA GU: voiding via urinal   Resolved Hospital Problem list   N/A  Assessment & Plan:   Acute toxic encephalopathy following ingestion of unknown grey powdery substance  - Continue narcan gtt for now wean off once pt able to maintain mentation  - Neuro checks q2hrs while on narcan gtt  - Urine drug screen pending  - Polysubstance abuse cessation counseling provided   Nondisplaced  fracture through the distal aspect of the medial malleolus Small minimally displaced fracture through the far medial aspect of the base of the 1st proximal phalanx extending into the 1st MTP joint - Podiatry consulted appreciate input~recommended right air cast ankle brace   Right foot wound (pt stepped on rusty nail) - Trend WBC and monitor fever curve  - Trend PCT and lactic acid  - Follow cultures  - C-reactive protein and sedimentation rate pending  - Will start vancomycin and cefepime  - Tetanus and diphtheria toxoids injection x1 dose   Blood clotting disorder Hx: DVT - Restart outpatient xarelto once pt can safely tolerate po medications   OSA - CPAP qhs - prn bronchodilator therapy   Best Practice (right click and "Reselect all SmartList Selections" daily)   Diet/type: NPO DVT prophylaxis: Restart outpatient xarelto once pt able to tolerate po's  GI prophylaxis: N/A Lines: N/A Foley:  N/A Code Status:  full code Last date of multidisciplinary goals of care discussion [N/A]  Labs   CBC: Recent Labs  Lab 01/01/22 1139  WBC 13.0*  NEUTROABS 9.9*  HGB 14.8  HCT 43.3  MCV 97.1  PLT [id number]    Basic Metabolic Panel: Recent Labs  Lab 01/01/22 1139  NA 138  K 3.9  CL 106  CO2 24  GLUCOSE 199*  BUN 20  CREATININE 1.12  CALCIUM 8.5*   GFR: Estimated Creatinine Clearance: 98.9 mL/min (by C-G formula based on SCr of 1.12 mg/dL). Recent Labs  Lab 01/01/22 1139  WBC 13.0*    Liver Function Tests: Recent Labs  Lab 01/01/22 1139  AST 42*  ALT 31  ALKPHOS 80  BILITOT 0.9  PROT 7.4  ALBUMIN 3.8   No results for input(s): LIPASE, AMYLASE in the last 168 hours. No results for input(s): AMMONIA in the last 168 hours.  ABG    Component Value Date/Time   TCO2 24 08/30/2020 2036     Coagulation Profile: No results for input(s): INR, PROTIME in the last 168 hours.  Cardiac Enzymes: No results for input(s): CKTOTAL, CKMB, CKMBINDEX, TROPONINI in the  last 168 hours.  HbA1C: No results found for: HGBA1C  CBG: No results for input(s): GLUCAP in the last 168 hours.  Review of Systems: Positives in BOLD   Gen: fever, chills, weight change, fatigue, night sweats, right foot pain secondary to wound HEENT: Denies blurred vision, double vision, hearing loss, tinnitus, sinus congestion, rhinorrhea, sore throat, neck stiffness, dysphagia PULM: Denies shortness of breath, cough, sputum production, hemoptysis, wheezing CV: Denies chest pain, edema, orthopnea, paroxysmal nocturnal dyspnea, palpitations GI: Denies abdominal pain, nausea, vomiting, diarrhea, hematochezia, melena, constipation, change in bowel habits GU: Denies dysuria, hematuria, polyuria, oliguria, urethral discharge Endocrine: Denies hot or cold intolerance, polyuria, polyphagia or appetite change Derm: Denies rash, dry skin, scaling or peeling skin change Heme: Denies easy bruising, bleeding, bleeding gums Neuro: Denies headache, numbness, weakness, slurred speech, loss of memory or consciousness  Past Medical History:  He,  has a past medical history of Blood clotting disorder ([place]), Chronic anticoagulation, Chronic back pain,  Chronic, continuous use of opioids, History of DVT (deep vein thrombosis), and Nicotine dependence.   Surgical History:   Past Surgical History:  Procedure Laterality Date   BACK SURGERY     ORIF ACETABULAR FRACTURE Left 09/01/2020   Procedure: OPEN REDUCTION INTERNAL FIXATION (ORIF) ACETABULAR FRACTURE. ORIF Left posterior wall acetabulum fracture;  Surgeon: Altamese [place], MD;  Location: [place];  Service: Orthopedics;  Laterality: Left;   SPLENECTOMY     SPLENECTOMY       Social History:   reports that he has been smoking. He has never used smokeless tobacco. He reports current alcohol use. He reports current drug use.   Family History:  His family history is not on file.   Allergies No Known Allergies   Home Medications  Prior to Admission  medications   Medication Sig Start Date End Date Taking? Authorizing Provider  amphetamine-dextroamphetamine (ADDERALL) 10 MG tablet Take 10 mg by mouth 2 (two) times daily. 12/11/21  Yes [provider]  buprenorphine (SUBUTEX) 2 MG SUBL SL tablet Place 2 mg under the tongue 2 (two) times daily as needed. 12/11/21  Yes [provider]  diazepam (VALIUM) 2 MG tablet Take 2 mg by mouth 2 (two) times daily. 12/11/21  Yes [provider]  oxyCODONE (ROXICODONE) 15 MG immediate release tablet Take 15 mg by mouth 5 (five) times daily.   Yes [provider]  oxyCODONE-acetaminophen (PERCOCET/ROXICET) 5-325 MG per tablet Take 1 tablet by mouth every 4 (four) hours as needed for severe pain. 05/26/15  Yes Gregor Hams, MD  rOPINIRole (REQUIP) 1 MG tablet Take 1 mg by mouth at bedtime.   Yes [provider]  sertraline (ZOLOFT) 100 MG tablet Take 100 mg by mouth daily. 11/13/21  Yes [provider]  XARELTO 20 MG TABS tablet Take 20 mg by mouth daily. 08/06/20  Yes [provider]  acetaminophen (TYLENOL) 500 MG tablet Take 2 tablets (1,000 mg total) by mouth every 6 (six) hours as needed. 09/05/20   Saverio Danker, PA-C  omeprazole (PRILOSEC) 40 MG capsule Take 40 mg by mouth daily. 08/03/20   [provider]  oxyCODONE 10 MG TABS Take 1-1.5 tablets (10-15 mg total) by mouth every 4 (four) hours as needed for moderate pain or severe pain (10mg  for moderate pain, 15mg  for severe pain). Patient not taking: Reported on 01/01/2022 09/05/20   Saverio Danker, PA-C     Critical care time: 40 minutes     Rosilyn Mings, [place] Pager [phone] (please enter 7 digits) PCCM Consult Pager [phone] (please enter 7 digits)

## 2022-01-01 NOTE — ED Notes (Signed)
Pharmacy notified that Narcan drip has finished. Pharmacy will mix another drip and notify me when it is ready

## 2022-01-01 NOTE — ED Notes (Signed)
Pt is asleep. Pt rouses to voice. EDP aware.

## 2022-01-02 DIAGNOSIS — R4189 Other symptoms and signs involving cognitive functions and awareness: Secondary | ICD-10-CM | POA: Diagnosis not present

## 2022-01-02 DIAGNOSIS — T402X1A Poisoning by other opioids, accidental (unintentional), initial encounter: Secondary | ICD-10-CM | POA: Diagnosis present

## 2022-01-02 MED ORDER — SULFAMETHOXAZOLE-TRIMETHOPRIM 800-160 MG PO TABS
1.0000 | ORAL_TABLET | Freq: Two times a day (BID) | ORAL | 0 refills | Status: AC
Start: 2022-01-02 — End: 2022-01-09

## 2022-01-02 MED ORDER — CIPROFLOXACIN HCL 500 MG PO TABS: 500.0000 mg | ORAL_TABLET | Freq: Two times a day (BID) | ORAL | 0 refills | Status: AC

## 2022-01-02 NOTE — ED Notes (Signed)
Pts PIV was leaking. This RN checked connection, still leaking, so tubing was changed. PIV still leaking. This RN switched the vancomycin to the R AC IV & no issues.  This RN updated pt on POC & also spoke with Nira Conn on the phone to update her on POC to d/c pt after vancomycin is finished, around 2pm. Nira Conn will work on arranging a ride for pt.

## 2022-01-02 NOTE — ED Notes (Signed)
RT notified to put c-pap on patient

## 2022-01-02 NOTE — Discharge Summary (Signed)
Physician Discharge Summary   Patient: Jonathan Rich MRN: [id number] DOB: 07-02-1966  Admit date:     01/01/2022  Discharge date: 01/02/22  Discharge Physician: Pennie Banter   PCP: Patient, No Pcp Per (Inactive)   Recommendations at discharge:    Follow up with podiatry Follow up with PCP in 1-2 weeks   Discharge Diagnoses: Principal Problem:   Unresponsiveness Active Problems:   Closed right ankle fracture   Toe fracture, right   DVT (deep venous thrombosis) (HCC)   Chronic pain syndrome   Acute respiratory failure with hypoxia (HCC)   Depression with anxiety   Opioid overdose [facility])    Hospital Course: Per H&P by Dr. Clyde Lundborg: " ALTER MOSS is a 56 y.o. male with medical history significant of chronic pain syndrome, chronic narcotic dependence (patient still taking Suboxone, oxycodone, Percocet), DVT on Xarelto, GERD, depression with anxiety, who presents with unresponsiveness.   Per report, pt stepped on a rusty nail today. He injured his right foot. Pt has puncture wound to R plantar surface. He also has pain in right ankle. He snorted a white-grey powder for pain relief, then became unresponsiveness.  Wife did CPR, then first responders arrived and gave rescue breaths only. Pt was given 6mg  Narcan IN given with little response. Then 1mg  Narcan was given IV and pt became more alert, but then he becomes less responsive again. Narcan gtt is started in ED. patient responded to Narcan drip. He becomes fully alert, and orientated x3 when I saw patient in ED.  Patient denies chest pain, cough, shortness of breath.  Denies nausea, vomiting, diarrhea or abdominal pain.  No symptoms of UTI. Of note, patient has chronic pain syndrome. He currently is taking Subutex 2 mg twice daily as needed, oxycodone 15 mg 5 times a day, Percocet 5-325 mg 4 times per day as needed.   ED Course: pt was found to have WBC 13.0, BNP 6.8, negative COVID PCR, GFR > 60, temperature normal, blood  pressure 100/65, heart rate 93, 84, RR 23, oxygen saturation 87 on room air initially, which improved to 94% on room air after woke up.  Chest x-ray showed low volume and vascular congestion.  CT head is negative for acute intracranial abnormalities.  Patient is admitted to stepdown as inpatient.  Dr. of ICU is consulted.   X-ray of right ankle: Essentially nondisplaced fracture through the distal aspect of the medial malleolus.   X-ray of right foot 1. Small, minimally displaced fracture through the far medial aspect of the base of the 1st proximal phalanx extending into the 1st MTP joint. 2. Diffuse soft tissue swelling in the plantar aspect of the distal foot."   Assessment and Plan: Unresponsiveness: CT head is negative for acute intracranial abnormalities.  Patient responded to Narcan treatment nicely.  Suspecting narcotic overdose. He currently is taking Subutex 2 mg twice daily as needed, oxycodone 15 mg 5 times a day, Percocet 5-325 mg 4 times per day as needed.  PCCM is consulted   -Admitted to stepdown as inpatient -Frequent neuro check -Held home narcotics -Required Narcan drip   Patient's mental status slowly improved and returned to baseline.    Chronic pain syndrome -Hold home narcotic medications Resume at d/c at lower doses. Pt counseled on safe use, only as prescribed   Right foot puncture wound, closed right ankle fracture, Toe fracture, right: consulted Dr. of podiatry -Broad antibiotics per Dr. Belia Heman -->vancomycin and cefepime (patient received 1 dose of Unasyn  in ED) -Tetanus prophylaxis: Patient was given tetanus/diphtheria toxoid - f/u Blood culture   DVT (deep venous thrombosis) (HCC) -Xarelto   Acute respiratory failure with hypoxia (HCC): Resolved.  Most likely due to narcotic overdose -As needed albuterol   Depression with anxiety -Hold Valium -Continue home Requip, Zoloft             Consultants: PCCM Procedures performed:    Disposition: Home Diet recommendation:  Regular diet  DISCHARGE MEDICATION: Allergies as of 01/02/2022   No Known Allergies      Medication List     TAKE these medications    acetaminophen 500 MG tablet Commonly known as: TYLENOL Take 2 tablets (1,000 mg total) by mouth every 6 (six) hours as needed.   amphetamine-dextroamphetamine 10 MG tablet Commonly known as: ADDERALL Take 10 mg by mouth 2 (two) times daily.   buprenorphine 2 MG Subl SL tablet Commonly known as: SUBUTEX Place 2 mg under the tongue 2 (two) times daily as needed.   ciprofloxacin 500 MG tablet Commonly known as: Cipro Take 1 tablet (500 mg total) by mouth 2 (two) times daily for 7 days.   diazepam 2 MG tablet Commonly known as: VALIUM Take 2 mg by mouth 2 (two) times daily.   omeprazole 40 MG capsule Commonly known as: PRILOSEC Take 40 mg by mouth daily.   oxyCODONE 15 MG immediate release tablet Commonly known as: ROXICODONE Take 15 mg by mouth 5 (five) times daily. What changed: Another medication with the same name was removed. Continue taking this medication, and follow the directions you see here.   oxyCODONE-acetaminophen 5-325 MG tablet Commonly known as: PERCOCET/ROXICET Take 1 tablet by mouth every 4 (four) hours as needed for severe pain.   rOPINIRole 1 MG tablet Commonly known as: REQUIP Take 1 mg by mouth at bedtime.   sertraline 100 MG tablet Commonly known as: ZOLOFT Take 100 mg by mouth daily.   sulfamethoxazole-trimethoprim 800-160 MG tablet Commonly known as: BACTRIM DS Take 1 tablet by mouth 2 (two) times daily for 7 days.   Xarelto 20 MG Tabs tablet Generic drug: rivaroxaban Take 20 mg by mouth daily.         Discharge Exam: Filed Weights   01/01/22 1135  Weight: 123.1 kg   General exam: awake, alert, no acute distress HEENT: atraumatic, clear conjunctiva, anicteric sclera, moist mucus membranes, hearing grossly normal  Respiratory system: CTAB, no  wheezes, rales or rhonchi, normal respiratory effort. Cardiovascular system: normal S1/S2, RRR, no JVD, murmurs, rubs, gallops, no pedal edema.   Central nervous system: A&O x3. no gross focal neurologic deficits, normal speech Extremities: wearing CAM boot, no edema, normal tone Skin: dry, intact, normal temperature Psychiatry: normal mood, congruent affect, judgement and insight appear normal   Condition at discharge: stable  The results of significant diagnostics from this hospitalization (including imaging, microbiology, ancillary and laboratory) are listed below for reference.   Imaging Studies: DG Ankle Complete Right  Result Date: 01/01/2022 CLINICAL DATA:  Pain in the plantar aspect of the distal foot and at the medial malleolus of the ankle with associated tenderness at the medial malleolus since stepping on a nail. EXAM: RIGHT ANKLE - COMPLETE 3+ VIEW COMPARISON:  Right foot radiographs obtained at the same time. FINDINGS: Transverse fracture through the distal aspect of the medial malleolus without significant displacement. Small adjacent corticated ossicles. There is evidence of an associated small effusion. Moderate inferior and minimal posterior calcaneal enthesophyte formation. Minimal dorsal tarsal spur formation. IMPRESSION:  Essentially nondisplaced fracture through the distal aspect of the medial malleolus. Electronically Signed   By: Beckie SaltsSteven  Reid M.D.   On: 01/01/2022 12:35   CT HEAD WO CONTRAST (5MM)  Result Date: 01/01/2022 CLINICAL DATA:  Mental status change, unknown cause EXAM: CT HEAD WITHOUT CONTRAST TECHNIQUE: Contiguous axial images were obtained from the base of the skull through the vertex without intravenous contrast. RADIATION DOSE REDUCTION: This exam was performed according to the departmental dose-optimization program which includes automated exposure control, adjustment of the mA and/or kV according to patient size and/or use of iterative reconstruction  technique. COMPARISON:  08/30/2020. FINDINGS: Brain: Encephalomalacia in the high right parietal lobe, unchanged. 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 No evidence of acute large vascular territory infarct. No hydrocephalus, acute hemorrhage, mass lesion, midline shift. Vascular: No hyperdense vessel identified. Skull: No acute fracture. Sinuses/Orbits: Air-fluid level in the right sphenoid sinus and retention cyst versus polyps in the left maxillary sinus. Other: Small right mastoid effusion. IMPRESSION: 1. No evidence of acute intracranial abnormality. 2. Similar right parietal encephalomalacia and remote left basal ganglia lacunar infarcts. Electronically Signed   By: Feliberto HartsFrederick S Jones M.D.   On: 01/01/2022 14:32   DG Chest Portable 1 View  Result Date: 01/01/2022 CLINICAL DATA:  Elevated lactic acid and white blood cell count. Fentanyl overdose. Evaluate for aspiration. EXAM: PORTABLE CHEST 1 VIEW COMPARISON:  08/30/2020 FINDINGS: Heart size appears normal. No pleural effusion or edema. Lung volumes are low. No airspace opacities. Pulmonary vascular congestion identified. Visualized osseous structures appear intact. IMPRESSION: 1. Low lung volumes and pulmonary vascular congestion. 2. No airspace opacities identified. Electronically Signed   By: Signa Kellaylor  Stroud M.D.   On: 01/01/2022 14:04   DG Foot Complete Right  Result Date: 01/01/2022 CLINICAL DATA:  Stepped on a nail at the base of the toes. Medial ankle pain and tenderness at the base of the medial malleolus since the injury. EXAM: RIGHT FOOT COMPLETE - 3+ VIEW COMPARISON:  Right ankle radiographs obtained at the same time. FINDINGS: Distal soft tissue swelling in the plantar aspect of the foot. Small fracture through the far medial aspect of the base of the 1st proximal phalanx extending into the 1st MTP joint. Minimal proximal displacement of the medial fragment. No radiopaque foreign body, soft tissue gas, bone destruction or  periosteal reaction. Moderate-sized inferior calcaneal enthesophyte. IMPRESSION: 1. Small, minimally displaced fracture through the far medial aspect of the base of the 1st proximal phalanx extending into the 1st MTP joint. 2. Diffuse soft tissue swelling in the plantar aspect of the distal foot. Electronically Signed   By: Beckie SaltsSteven  Reid M.D.   On: 01/01/2022 12:31    Microbiology: Results for orders placed or performed during the hospital encounter of 01/01/22  Resp Panel by RT-PCR (Flu A&B, Covid) Nasopharyngeal Swab     Status: None   Collection Time: 01/01/22  2:50 PM   Specimen: Nasopharyngeal Swab; Nasopharyngeal(NP) swabs in vial transport medium  Result Value Ref Range Status   SARS Coronavirus 2 by RT PCR NEGATIVE NEGATIVE Final    Comment: (NOTE) SARS-CoV-2 target nucleic acids are NOT DETECTED.  The SARS-CoV-2 RNA is generally detectable in upper respiratory specimens during the acute phase of infection. The lowest concentration of SARS-CoV-2 viral copies this assay can detect is 138 copies/mL. A negative result does not preclude SARS-Cov-2 infection and should not be used as the sole basis for treatment or other patient management decisions. A negative result may occur with  improper specimen collection/handling, submission of specimen other than nasopharyngeal swab, presence of viral mutation(s) within the areas targeted by this assay, and inadequate number of viral copies(<138 copies/mL). A negative result must be combined with clinical observations, patient history, and epidemiological information. The expected result is Negative.  Fact Sheet for Patients:  BloggerCourse.com  Fact Sheet for Healthcare Providers:  SeriousBroker.it  This test is no t yet approved or cleared by the [place] FDA and  has been authorized for detection and/or diagnosis of SARS-CoV-2 by FDA under an Emergency Use Authorization (EUA). This  EUA will remain  in effect (meaning this test can be used) for the duration of the COVID-19 declaration under Section 564(b)(1) of the Act, 21 U.S.C.section 360bbb-3(b)(1), unless the authorization is terminated  or revoked sooner.       Influenza A by PCR NEGATIVE NEGATIVE Final   Influenza B by PCR NEGATIVE NEGATIVE Final    Comment: (NOTE) The Xpert Xpress SARS-CoV-2/FLU/RSV plus assay is intended as an aid in the diagnosis of influenza from Nasopharyngeal swab specimens and should not be used as a sole basis for treatment. Nasal washings and aspirates are unacceptable for Xpert Xpress SARS-CoV-2/FLU/RSV testing.  Fact Sheet for Patients: BloggerCourse.com  Fact Sheet for Healthcare Providers: SeriousBroker.it  This test is not yet approved or cleared by the [place] FDA and has been authorized for detection and/or diagnosis of SARS-CoV-2 by FDA under an Emergency Use Authorization (EUA). This EUA will remain in effect (meaning this test can be used) for the duration of the COVID-19 declaration under Section 564(b)(1) of the Act, 21 U.S.C. section 360bbb-3(b)(1), unless the authorization is terminated or revoked.  Performed at [facility], [address] [place], [place] [address]   CULTURE, BLOOD (ROUTINE X 2) w Reflex to ID Panel     Status: None (Preliminary result)   Collection Time: 01/01/22  5:31 PM   Specimen: BLOOD  Result Value Ref Range Status   Specimen Description BLOOD BLOOD RIGHT ARM  Final   Special Requests   Final    BOTTLES DRAWN AEROBIC AND ANAEROBIC Blood Culture adequate volume   Culture   Final    NO GROWTH < 12 HOURS Performed at [facility], [address] [place], [place] [address]    Report Status PENDING  Incomplete  CULTURE, BLOOD (ROUTINE X 2) w Reflex to ID Panel     Status: None (Preliminary result)   Collection Time: 01/01/22  5:32 PM   Specimen: BLOOD   Result Value Ref Range Status   Specimen Description BLOOD BLOOD LEFT ARM  Final   Special Requests   Final    BOTTLES DRAWN AEROBIC AND ANAEROBIC Blood Culture adequate volume   Culture   Final    NO GROWTH < 12 HOURS Performed at [facility], [address] [place], [place] [address]    Report Status PENDING  Incomplete    Labs: CBC: Recent Labs  Lab 01/01/22 1139  WBC 13.0*  NEUTROABS 9.9*  HGB 14.8  HCT 43.3  MCV 97.1  PLT 309   Basic Metabolic Panel: Recent Labs  Lab 01/01/22 1139  NA 138  K 3.9  CL 106  CO2 24  GLUCOSE 199*  BUN 20  CREATININE 1.12  CALCIUM 8.5*   Liver Function Tests: Recent Labs  Lab 01/01/22 1139  AST 42*  ALT 31  ALKPHOS 80  BILITOT 0.9  PROT 7.4  ALBUMIN 3.8   CBG: No results for input(s):  GLUCAP in the last 168 hours.  Discharge time spent: greater than 30 minutes.  Signed: Pennie BanterKelly A Conway Fedora, DO [facility] 01/02/2022

## 2022-01-02 NOTE — ED Notes (Signed)
Lab contacted to collect patient's 5am labs.

## 2022-01-02 NOTE — ED Notes (Signed)
Patient provided with toothpaste and toothbrush. Provided with graham crackers and peanut butter.

## 2022-01-02 NOTE — ED Notes (Signed)
Pt awake, states he is ready to leave. Advised pt that he keeps going back to sleep. Pt given telephone to call his wife

## 2022-01-06 LAB — CULTURE, BLOOD (ROUTINE X 2)
Culture: NO GROWTH
Culture: NO GROWTH
Special Requests: ADEQUATE
Special Requests: ADEQUATE
